# Patient Record
Sex: Male | Born: 1955
Health system: Southern US, Community
[De-identification: ages and names within clinical notes are randomized; demographics above are authoritative.]

## PROBLEM LIST (undated history)

## (undated) DIAGNOSIS — Z9289 Personal history of other medical treatment: Secondary | ICD-10-CM

## (undated) DIAGNOSIS — M1990 Unspecified osteoarthritis, unspecified site: Secondary | ICD-10-CM

## (undated) DIAGNOSIS — N189 Chronic kidney disease, unspecified: Secondary | ICD-10-CM

## (undated) DIAGNOSIS — I10 Essential (primary) hypertension: Secondary | ICD-10-CM

## (undated) DIAGNOSIS — Z973 Presence of spectacles and contact lenses: Secondary | ICD-10-CM

## (undated) DIAGNOSIS — H3530 Unspecified macular degeneration: Secondary | ICD-10-CM

## (undated) DIAGNOSIS — Z87898 Personal history of other specified conditions: Secondary | ICD-10-CM

## (undated) DIAGNOSIS — K219 Gastro-esophageal reflux disease without esophagitis: Secondary | ICD-10-CM

## (undated) DIAGNOSIS — Q825 Congenital non-neoplastic nevus: Secondary | ICD-10-CM

## (undated) DIAGNOSIS — N529 Male erectile dysfunction, unspecified: Secondary | ICD-10-CM

## (undated) DIAGNOSIS — T7840XA Allergy, unspecified, initial encounter: Secondary | ICD-10-CM

## (undated) DIAGNOSIS — R7301 Impaired fasting glucose: Secondary | ICD-10-CM

## (undated) DIAGNOSIS — E785 Hyperlipidemia, unspecified: Secondary | ICD-10-CM

## (undated) DIAGNOSIS — I700 Atherosclerosis of aorta: Secondary | ICD-10-CM

## (undated) DIAGNOSIS — D171 Benign lipomatous neoplasm of skin and subcutaneous tissue of trunk: Secondary | ICD-10-CM

## (undated) DIAGNOSIS — E790 Hyperuricemia without signs of inflammatory arthritis and tophaceous disease: Secondary | ICD-10-CM

## (undated) DIAGNOSIS — R0683 Snoring: Secondary | ICD-10-CM

## (undated) HISTORY — DX: Personal history of other specified conditions: Z87.898

## (undated) HISTORY — DX: Congenital non-neoplastic nevus: Q82.5

## (undated) HISTORY — PX: TONSILLECTOMY: SUR1361

## (undated) HISTORY — DX: Unspecified macular degeneration: H35.30

## (undated) HISTORY — DX: Male erectile dysfunction, unspecified: N52.9

## (undated) HISTORY — DX: Chronic kidney disease, unspecified: N18.9

## (undated) HISTORY — DX: Benign lipomatous neoplasm of skin and subcutaneous tissue of trunk: D17.1

## (undated) HISTORY — DX: Gastro-esophageal reflux disease without esophagitis: K21.9

## (undated) HISTORY — DX: Impaired fasting glucose: R73.01

## (undated) HISTORY — DX: Allergy, unspecified, initial encounter: T78.40XA

## (undated) HISTORY — DX: Essential (primary) hypertension: I10

## (undated) HISTORY — PX: OTHER SURGICAL HISTORY: SHX169

## (undated) HISTORY — DX: Personal history of other medical treatment: Z92.89

## (undated) HISTORY — DX: Presence of spectacles and contact lenses: Z97.3

## (undated) HISTORY — DX: Hyperlipidemia, unspecified: E78.5

## (undated) HISTORY — DX: Snoring: R06.83

## (undated) HISTORY — DX: Unspecified osteoarthritis, unspecified site: M19.90

## (undated) HISTORY — DX: Hyperuricemia without signs of inflammatory arthritis and tophaceous disease: E79.0

---
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
## 1998-05-07 ENCOUNTER — Ambulatory Visit (HOSPITAL_COMMUNITY): Admission: RE | Admit: 1998-05-07 | Discharge: 1998-05-07 | Payer: Self-pay | Admitting: Urology

## 2003-12-31 ENCOUNTER — Ambulatory Visit (HOSPITAL_COMMUNITY): Admission: RE | Admit: 2003-12-31 | Discharge: 2003-12-31 | Payer: Self-pay | Admitting: Thoracic Surgery

## 2004-02-03 ENCOUNTER — Encounter: Admission: RE | Admit: 2004-02-03 | Discharge: 2004-02-03 | Payer: Self-pay | Admitting: Thoracic Surgery

## 2004-02-11 ENCOUNTER — Encounter (INDEPENDENT_AMBULATORY_CARE_PROVIDER_SITE_OTHER): Payer: Self-pay | Admitting: Specialist

## 2004-02-11 ENCOUNTER — Inpatient Hospital Stay (HOSPITAL_COMMUNITY): Admission: RE | Admit: 2004-02-11 | Discharge: 2004-02-13 | Payer: Self-pay | Admitting: Thoracic Surgery

## 2004-02-18 ENCOUNTER — Encounter: Admission: RE | Admit: 2004-02-18 | Discharge: 2004-02-18 | Payer: Self-pay | Admitting: Thoracic Surgery

## 2004-03-10 ENCOUNTER — Encounter: Admission: RE | Admit: 2004-03-10 | Discharge: 2004-03-10 | Payer: Self-pay | Admitting: Thoracic Surgery

## 2004-04-07 ENCOUNTER — Encounter: Admission: RE | Admit: 2004-04-07 | Discharge: 2004-04-07 | Payer: Self-pay | Admitting: Thoracic Surgery

## 2004-08-10 ENCOUNTER — Encounter: Admission: RE | Admit: 2004-08-10 | Discharge: 2004-08-10 | Payer: Self-pay | Admitting: Thoracic Surgery

## 2005-09-04 HISTORY — PX: OTHER SURGICAL HISTORY: SHX169

## 2006-08-08 ENCOUNTER — Encounter: Admission: RE | Admit: 2006-08-08 | Discharge: 2006-08-08 | Payer: Self-pay | Admitting: Family Medicine

## 2006-09-04 HISTORY — PX: COLONOSCOPY: SHX174

## 2007-07-30 LAB — HM COLONOSCOPY

## 2008-09-20 ENCOUNTER — Emergency Department (HOSPITAL_COMMUNITY): Admission: EM | Admit: 2008-09-20 | Discharge: 2008-09-20 | Payer: Self-pay | Admitting: Family Medicine

## 2008-09-25 ENCOUNTER — Encounter: Admission: RE | Admit: 2008-09-25 | Discharge: 2008-09-25 | Payer: Self-pay | Admitting: Orthopedic Surgery

## 2009-06-04 DIAGNOSIS — Z9289 Personal history of other medical treatment: Secondary | ICD-10-CM

## 2009-06-04 HISTORY — DX: Personal history of other medical treatment: Z92.89

## 2009-08-04 HISTORY — PX: NM MYOVIEW LTD: HXRAD82

## 2010-09-25 ENCOUNTER — Encounter: Payer: Self-pay | Admitting: Family Medicine

## 2010-09-25 ENCOUNTER — Encounter: Payer: Self-pay | Admitting: Thoracic Surgery

## 2010-12-19 LAB — DIFFERENTIAL
Basophils Absolute: 0 10*3/uL (ref 0.0–0.1)
Basophils Relative: 0 % (ref 0–1)
Eosinophils Absolute: 0.1 10*3/uL (ref 0.0–0.7)
Eosinophils Relative: 2 % (ref 0–5)
Lymphocytes Relative: 30 % (ref 12–46)
Lymphs Abs: 1.7 10*3/uL (ref 0.7–4.0)
Monocytes Absolute: 0.5 10*3/uL (ref 0.1–1.0)
Monocytes Relative: 10 % (ref 3–12)
Neutro Abs: 3.3 10*3/uL (ref 1.7–7.7)
Neutrophils Relative %: 58 % (ref 43–77)

## 2010-12-19 LAB — CBC
HCT: 44.1 % (ref 39.0–52.0)
Hemoglobin: 14.6 g/dL (ref 13.0–17.0)
MCHC: 33.1 g/dL (ref 30.0–36.0)
MCV: 95.4 fL (ref 78.0–100.0)
Platelets: 308 10*3/uL (ref 150–400)
RBC: 4.62 MIL/uL (ref 4.22–5.81)
RDW: 13.1 % (ref 11.5–15.5)
WBC: 5.7 10*3/uL (ref 4.0–10.5)

## 2010-12-19 LAB — URIC ACID: Uric Acid, Serum: 6.6 mg/dL (ref 4.0–7.8)

## 2011-01-20 NOTE — H&P (Signed)
NAME:  Dave Carpenter, Dave Carpenter                           ACCOUNT NO.:  [id number]   MEDICAL RECORD NO.:  [id number]                   PATIENT TYPE:  INP   LOCATION:  NA                                   FACILITY:  [facility]   PHYSICIAN:  Ines Bloomer, M.D.              DATE OF BIRTH:  07-12-1956   DATE OF ADMISSION:  DATE OF DISCHARGE:                                HISTORY & PHYSICAL   ANTICIPATED DATE OF ADMISSION:  June 9.   PRIMARY CARE Dave Carpenter:  Dr. Ivonne Carpenter.   UROLOGIST:  Dr. Dennison Nancy. Carpenter.   HISTORY OF PRESENT ILLNESS:  This is a 55 year old, African-American male  who is referred from Dr. Aldean Carpenter for evaluation of a right lower lobe lung  lesion.  The patient was undergoing a urologic workup that included a CT  scan of the bladder and lower chest.  A right lower lobe superior segment  lesion was identified.  A full CT scan followed and revealed a 2.6 x 2.5 cm  nodule in the right lower lobe with internal calcification.  This was read  as carcinoma versus hamartoma.  He was evaluated at the [facility] office by Dr.  Edwyna Carpenter on December 22, 2003.  Evaluation included pulmonary function tests that  revealed FVC 2.6 and FEV1 of 2.15.  On evaluation he reported no bronchitis,  asthma, hemoptysis and no weight loss.  Dr. Edwyna Carpenter recommended proceeding  with a PET scan.  This was performed on April 28.  It was negative for  activity in the right lower lobe and Dr. Scheryl Carpenter impression was a probable  hamartoma.  Given it's size, he recommended excision via VATS approach.  The  patient elected not to proceed at that time.  He returned to the office on  June 1 following a chest x-ray.  The lesion now measured 3 cm in size.  Even  though this lesion is felt to be benign, Dr. Edwyna Carpenter still recommends  proceeding with surgical excision.  The procedure, risks and benefits were  discussed with Dave Carpenter.  He agrees to proceed with surgery at this time.  He is here today for a preoperative history  and physical exam.   PAST MEDICAL HISTORY:  1. Hypertension.  2. ED.   PAST SURGICAL HISTORY:  1. Tonsillectomy as a child.  2. Ganglion cystectomy on the right wrist.  3. Circumcision 2000.   He is allergic to ASPIRIN, causes a nervous stomach and a rash.   MEDICATIONS PRIOR TO ADMISSION:  Cialis on occasion.   SOCIAL HISTORY:  He is single.  He has one daughter, 61 years old.  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  He continues to smoke cigarettes;  now down to one half pack a day.  He does not drink alcohol.  He lives with  his significant other, Badanabuis, in a multilevel dwelling.  She will be  available for  assistance after discharge from the hospital.   FAMILY HISTORY:  Mother and father both have diabetes.  He has one brother  with sickle cell trait.   REVIEW OF SYSTEMS:  GENERAL:  Mr. Dave Carpenter feels that his health is very good.  He has had some mild change in his vision.  No changes in hearing.  No  difficulties chewing or swallowing.  He does wear eyeglasses.  He has had no  dyspnea on exertion.  He does have a dry cough.  No chest pain.  No GI  complaints, specifically no changes in bowel habits.  No nausea, vomiting or  diarrhea.  GU:  He has had microscopic hematuria, worked up by Dr.  Aldean Carpenter; cause unknown.  No dizziness, syncope or falls.  He does have  bad knees.  They occasionally hurt when he is ambulating.  No calf  claudication.  No skin rashes or breakdown.  No recent infection, fever.  No  history of blood clots.  No symptoms of depression or anxiety.   PHYSICAL EXAMINATION:  VITAL SIGNS:  Blood pressure 128/94; heart rate 100;  he is 6 feet 1 inch tall; approximately 195 to 198 pounds.  EYES:  PERRLA, EOMI.  Oral mucosa is pink and moist.  NECK:  Full range of motion.  No carotid bruits, thyromegaly, or  lymphadenopathy.  LUNGS:  His breathing is nonlabored.  His breath sounds are clear  bilaterally.  HEART:  Regular rate and rhythm.   ABDOMEN:  Flat with bowel sounds.  Soft and nontender.  EXTREMITIES:  He has no lower extremity edema.  No clubbing or cyanosis of  his extremities.  NEUROLOGIC:  He is alert and oriented.  Cranial nerves II to XII are grossly  intact.  His gait is steady.  He has full range of motion in all  extremities.  Muscle strength is 5/5 in all of his extremities.   Laboratory studies are pending and will include CBC, BMET, PT/INR and a  urinalysis.   IMPRESSION:  This is a 55 year old man with a right lower lobe lung nodule.   PLAN:  Elective admission to [facility] in the care of Dr. Edwyna Carpenter  on February 11, 2004 for right video-assisted thoracoscopy and wedge resection of  a right lower lobe nodule.  The patient has been counseled regarding smoking  cessation and plans to quit on admission to the hospital.      Dave Carpenter, N.P.                  Ines Bloomer, M.D.    CTK/MEDQ  D:  02/09/2004  T:  02/10/2004  Job:  [id number]   cc:   Ines Bloomer, M.D.  [address]  [place]  [place] [address]

## 2011-01-20 NOTE — Op Note (Signed)
NAME:  Dave Carpenter, Dave Carpenter NO.:  [id number]   MEDICAL RECORD NO.:  [id number]                   PATIENT TYPE:  INP   LOCATION:  2899                                 FACILITY:  [facility]   PHYSICIAN:  Ines Bloomer, M.D.              DATE OF BIRTH:  1956/05/10   DATE OF PROCEDURE:  DATE OF DISCHARGE:                                 OPERATIVE REPORT   PREOPERATIVE DIAGNOSIS:  Right lower lobe lesion, possible hamartoma.   POSTOPERATIVE DIAGNOSIS:  Hamartoma, right lower lobe.   OPERATION PERFORMED:  Right video assisted thoracoscopic surgery, resection  of right lower lobe lesion.   SURGEON:  Ines Bloomer, M.D.   ASSISTANT:  Jerold Coombe, P.A.   ANESTHESIA:  General.   DESCRIPTION OF PROCEDURE:  After general anesthesia and insertion of dual  lumen tube, the patient turned to the right lateral thoracotomy position,  was prepped and draped in the usual sterile manner.  Trocar site was made in  the seventh intercostal space at the anterior axillary line.  The lung had  been deflated and a 30 degree scope was inserted and a large lesion was seen  in the superior segment of the right lower lobe.  This appeared to be a  hamartoma that was partially extrapleural.  Right over the lesion was made a  2 to 3 cm incision, taking care to do minimal spreading or no spreading of  intercostal space.  Latissimus was partially divided and the intercostal  space was entered. The lung right next to the lesion was then grasped with a  #1 Kaiser ring forceps and then bring in the EZ-45 stapler through this  site, this was fired medially to laterally into the lung and then we were  able to switching position bring the stapler in through the medial trocar  site with three applications, we resected the lesion.  We then put CoSeal on  the staple line in the usual fashion.  Chest tube was inserted through the  trocar site.  Marcaine block was done in the usual fashion.   Frozen section  revealed a hamartoma.  A 2 to 3 cm trocar site was closed with one #1 Vicryl  in the muscle layer, 2-0 Vicryl in the subcutaneous tissue and 3-0 Vicryl as  a subcuticular stitch.  The patient was returned to the recovery room in  stable condition.  It might be mentioned that we extracted the lesion  through the trocar site using a Ethicon EndoPouch.                                               Ines Bloomer, M.D.    DPB/MEDQ  D:  02/11/2004  T:  02/11/2004  Job:  [id number]

## 2011-01-20 NOTE — Discharge Summary (Signed)
NAME:  Dave Carpenter, Dave Carpenter NO.:  [id number]   MEDICAL RECORD NO.:  [id number]                   PATIENT TYPE:  INP   LOCATION:  3308                                 FACILITY:  [facility]   PHYSICIAN:  Ines Bloomer, M.D.              DATE OF BIRTH:  04-Jan-1956   DATE OF ADMISSION:  02/11/2004  DATE OF DISCHARGE:  02/13/2004                                 DISCHARGE SUMMARY   PRIMARY ADMITTING DIAGNOSIS:  Right lower lobe lung lesion.   ADDITIONAL/DISCHARGE DIAGNOSES:  1. Right lower lobe hamartoma.  2. History of hypertension.  3. History of erectile dysfunction.   PROCEDURES PERFORMED:  Right VATS with right mini thoracotomy, resection of  right lower lobe lung lesion.   HISTORY:  The patient is a 55 year old black male who was referred to Dr.  Edwyna Shell from Dr. Aldean Ast for evaluation of a right lower lobe lung lesion.  The patient was in the process of undergoing urologic workup and in the  course of this workup had a CT scan of the bladder and lower chest.  An  incidental finding of a right lower lobe superior segment lesion was noted.  A full CT scan of the chest was then performed and this revealed a 2.6 x 2.5  cm nodule in the right lower lobe with internal calcification which was felt  to represent either a carcinoma or a hamartoma.  Patient was seen by Dr.  Edwyna Shell and although he had remained asymptomatic, denying weight loss,  fevers, chills, shortness of breath, chest pain, hemoptysis or dyspnea on  exertion, Dr. Edwyna Shell recommended proceeding with a PET scan.  This was  performed on December 31, 2003 and showed no activity in the right lower lobe.  It was felt that this did represent a hamartoma most likely and Dr. Edwyna Shell  recommended proceeding with a right VATS and excision of the lesion.  At the  time the patient elected not to proceed with surgery.  He returned for  followup on February 03, 2004 with a new chest x-ray which showed an increase in  size of lesion now to 3 cm.  Again because of the increase in the size of  the lesion Dr. Edwyna Shell recommended proceeding with surgery and at this point  the patient agreed to proceed.   HOSPITAL COURSE:  He was admitted to [facility] on February 11, 2004 and was  taken to the operating room where he underwent a right VATS with a right  mini thoracotomy and resection of the right lower lobe lesion.  Intraoperative frozen section and final pathology revealed a hamartoma.  Patient tolerated the procedure well and was transferred to the 3300 unit in  stable condition.  Postoperatively he has done very well.  On postop day #1  his chest tube had minimal drainage and no evidence of an air leak.  A chest  x-ray showed no  evidence of pneumothorax.  At that time his chest tube was  removed.  A followup chest x-ray after the chest tube removal is pending at  this time.  He has remained afebrile and all vital signs have been stable.  His labs have remained stable with a hemoglobin of 13.3, hematocrit 39.7,  platelets 248, white count 9.9, potassium 4.3, BUN 10, creatinine 1.2.  His  surgical incision site is healing well.  He has been ambulating in the halls  without difficulty.  He is tolerating a regular diet and is having normal  bowel and bladder function.  He has been counseled regarding smoking  cessation and a plan has been made for him to quit smoking.  He has been  given educational information to take home.  It is felt if he continues to  remain stable over the next 24 hours and a PA and lateral chest x-ray on  February 13, 2004 shows no evidence of pneumothorax he will hopefully be ready  for discharge home at that time.   DISCHARGE MEDICATIONS:  Tylox one to two q.4h. p.r.n. for pain.   DISCHARGE INSTRUCTIONS:  He is to refrain from driving, heavy lifting or  strenuous activity.  He may continue ambulating daily and using his  incentive spirometer.  He may shower daily and clean his incisions  with soap  and water.  He will continue his same preoperative diet.   DISCHARGE FOLLOWUP:  He will see Dr. Edwyna Shell back in the office in 1 week and  will have a chest x-ray at [facility] 1 hour prior to this  appointment.  He should bring the films to Dr. Scheryl Darter office for him to  review.  In the interim if he experiences any problems or has questions he  is asked to contact our office immediately.      Coral Ceo, P.A.                        Ines Bloomer, M.D.    GC/MEDQ  D:  02/12/2004  T:  02/13/2004  Job:  [id number]   cc:   Dr. Darcel Bayley Shelle Iron., M.D.  [address]  [place]  [place] [address]  Fax: [phone]

## 2012-02-28 ENCOUNTER — Other Ambulatory Visit: Payer: Self-pay

## 2015-03-24 ENCOUNTER — Encounter: Payer: Self-pay | Admitting: Neurology

## 2015-03-24 ENCOUNTER — Ambulatory Visit (INDEPENDENT_AMBULATORY_CARE_PROVIDER_SITE_OTHER): Payer: Managed Care, Other (non HMO) | Admitting: Neurology

## 2015-03-24 VITALS — BP 142/90 | HR 84 | Resp 20 | Ht 72.0 in | Wt 227.0 lb

## 2015-03-24 DIAGNOSIS — G4763 Sleep related bruxism: Secondary | ICD-10-CM | POA: Diagnosis not present

## 2015-03-24 DIAGNOSIS — G4710 Hypersomnia, unspecified: Secondary | ICD-10-CM | POA: Diagnosis not present

## 2015-03-24 DIAGNOSIS — R0683 Snoring: Secondary | ICD-10-CM | POA: Insufficient documentation

## 2015-03-24 DIAGNOSIS — G2581 Restless legs syndrome: Secondary | ICD-10-CM

## 2015-03-24 DIAGNOSIS — G4730 Sleep apnea, unspecified: Secondary | ICD-10-CM

## 2015-03-24 NOTE — Patient Instructions (Signed)
Sleep Apnea  Sleep apnea is a sleep disorder characterized by abnormal pauses in breathing while you sleep. When your breathing pauses, the level of oxygen in your blood decreases. This causes you to move out of deep sleep and into light sleep. As a result, your quality of sleep is poor, and the system that carries your blood throughout your body (cardiovascular system) experiences stress. If sleep apnea remains untreated, the following conditions can develop:  High blood pressure (hypertension).  Coronary artery disease.  Inability to achieve or maintain an erection (impotence).  Impairment of your thought process (cognitive dysfunction). There are three types of sleep apnea: 1. Obstructive sleep apnea--Pauses in breathing during sleep because of a blocked airway. 2. Central sleep apnea--Pauses in breathing during sleep because the area of the brain that controls your breathing does not send the correct signals to the muscles that control breathing. 3. Mixed sleep apnea--A combination of both obstructive and central sleep apnea. RISK FACTORS The following risk factors can increase your risk of developing sleep apnea:  Being overweight.  Smoking.  Having narrow passages in your nose and throat.  Being of older age.  Being male.  Alcohol use.  Sedative and tranquilizer use.  Ethnicity. Among individuals younger than 35 years, African Americans are at increased risk of sleep apnea. SYMPTOMS   Difficulty staying asleep.  Daytime sleepiness and fatigue.  Loss of energy.  Irritability.  Loud, heavy snoring.  Morning headaches.  Trouble concentrating.  Forgetfulness.  Decreased interest in sex. DIAGNOSIS  In order to diagnose sleep apnea, your caregiver will perform a physical examination. Your caregiver may suggest that you take a home sleep test. Your caregiver may also recommend that you spend the night in a sleep lab. In the sleep lab, several monitors record  information about your heart, lungs, and brain while you sleep. Your leg and arm movements and blood oxygen level are also recorded. TREATMENT The following actions may help to resolve mild sleep apnea:  Sleeping on your side.   Using a decongestant if you have nasal congestion.   Avoiding the use of depressants, including alcohol, sedatives, and narcotics.   Losing weight and modifying your diet if you are overweight. There also are devices and treatments to help open your airway:  Oral appliances. These are custom-made mouthpieces that shift your lower jaw forward and slightly open your bite. This opens your airway.  Devices that create positive airway pressure. This positive pressure "splints" your airway open to help you breathe better during sleep. The following devices create positive airway pressure:  Continuous positive airway pressure (CPAP) device. The CPAP device creates a continuous level of air pressure with an air pump. The air is delivered to your airway through a mask while you sleep. This continuous pressure keeps your airway open.  Nasal expiratory positive airway pressure (EPAP) device. The EPAP device creates positive air pressure as you exhale. The device consists of single-use valves, which are inserted into each nostril and held in place by adhesive. The valves create very little resistance when you inhale but create much more resistance when you exhale. That increased resistance creates the positive airway pressure. This positive pressure while you exhale keeps your airway open, making it easier to breath when you inhale again.  Bilevel positive airway pressure (BPAP) device. The BPAP device is used mainly in patients with central sleep apnea. This device is similar to the CPAP device because it also uses an air pump to deliver continuous air pressure   through a mask. However, with the BPAP machine, the pressure is set at two different levels. The pressure when you  exhale is lower than the pressure when you inhale.  Surgery. Typically, surgery is only done if you cannot comply with less invasive treatments or if the less invasive treatments do not improve your condition. Surgery involves removing excess tissue in your airway to create a wider passage way. Document Released: 08/11/2002 Document Revised: 12/16/2012 Document Reviewed: 12/28/2011 [employer] Patient Information 2015 [employer]. This information is not intended to replace advice given to you by your health care provider. Make sure you discuss any questions you have with your health care provider.  

## 2015-03-24 NOTE — Progress Notes (Signed)
SLEEP MEDICINE CLINIC   Provider:  Larey Seat, M D  Referring Provider: Barney Drain, DMD Primary Care Physician:  No primary care provider on file.  Chief Complaint  Patient presents with  . sleep consult    snoring, osa eval, rm 10, alone    HPI:  Dave Carpenter is a 59 y.o. male  Is seen here as a referralfrom Dave Carpenter ,DDS  for a sleep apnea evaluation,  [name] is an african american right handed male patient, working as an Database administrator. He works form 8 - 5 daily, has no history of shift work.  The patient's was seen at [facility] for a regular dental appointment where he filled a questionnaire that was so suspicious for him having obstructive sleep apnea. Primary care physician is Dr. Izora Gala. In short the sleep screening questionnaire endorsed that the patient is 6 foot tall has a weight of 225 pounds and endorsed the Epworth sleepiness score at 5 points. Attention he snores frequently he feels tired in daytime also he doesn't necessarily feel that he is ready to fall asleep he has bruxism and neck size over 17 inches and has had witnessed apneas.  That habits are as follows usually goes to sleep on his cough around 10:00 PM and transversely the bedroom between 2 and 3 AM. His wife works the late shift and comes home at the time when he transfers. His wife witnessed him to snore loudly and has seen him with irregular breathing. He wakes up about 6:30 every morning spontaneously and without the need for an alarm to be set. As no nocturia breaks. Otherwise his sleep is unfragmented. He states that he feels actually lousy in the morning and neither refreshed nor restored his aching he feels sore and worn. He is reportedly a restless sleeper and tosses and turns at night and he has bruxism which is a form of periodic limb movements. Also endorsed some restless leg syndrome symptoms. He sometimes has the irresistible urge to move to stop the feeling of  a creepy crawly sensation.  He does not have a daytime nap nor urge. He used to nap over his lunch break in his car. He stopped that. His naps were more refreshing then the nocturnal sleep. He sometimes sleeps on Sunday after Lunch.   The patient has no history of trauma to the airway neck or facial skull no obstruction, he is unaware of any sleep disorders affecting him in childhood such as sleepwalking or sleep talking. Family history is also negative for sleep disorders or sleep disordered breathing.  He is a non smoker, quit 2006, he quit drinking, 2 sodas a day , coke.    Review of Systems: Out of a complete 14 system review, the patient complains of only the following symptoms, and all other reviewed systems are negative. Snoring, sleep apnea, feeling exhausted. RLS - creepy crawly .   Epworth score 9 , Fatigue severity score 22  , depression score 0.   History   Social History  . Marital Status: Single    Spouse Name: N/A  . Number of Children: N/A  . Years of Education: N/A   Occupational History  . [employer]    Social History Main Topics  . Smoking status: Former Smoker    Quit date: 09/04/2004  . Smokeless tobacco: Not on file  . Alcohol Use: No  . Drug Use: No  . Sexual Activity: Not on file   Other  Topics Concern  . Not on file   Social History Narrative   Drinks 32 oz of soda daily.    Family History  Problem Relation Age of Onset  . Diabetes Mother   . Stroke Mother   . Stroke Father   . Diabetes Father     Past Medical History  Diagnosis Date  . Hypertension   . Seizures     Past Surgical History  Procedure Laterality Date  . Tonsillectomy    . Ganglion cyst removal    . Right lung surgery to remove mass      Current Outpatient Prescriptions  Medication Sig Dispense Refill  . carvedilol (COREG) 6.25 MG tablet Take 6.25 mg by mouth 2 (two) times daily with a meal.    . lisinopril (PRINIVIL,ZESTRIL) 5 MG tablet Take 5 mg by mouth daily.    .  simvastatin (ZOCOR) 40 MG tablet Take 40 mg by mouth daily.     No current facility-administered medications for this visit.    Allergies as of 03/24/2015 - Review Complete 03/24/2015  Allergen Reaction Noted  . Aspirin  03/23/2015    Vitals: BP 142/90 mmHg  Pulse 84  Resp 20  Ht 6' (1.829 m)  Wt 227 lb (102.967 kg)  BMI 30.78 kg/m2 Last Weight:  Wt Readings from Last 1 Encounters:  03/24/15 227 lb (102.967 kg)       Last Height:   Ht Readings from Last 1 Encounters:  03/24/15 6' (1.829 m)    Physical exam:  General: The patient is awake, alert and appears not in acute distress. The patient is well groomed. Head: Normocephalic, atraumatic. Neck is supple. Mallampati 3  neck circumference:18. Nasal airflow unrestricted , TMJ click on the right  Is evident . Retrognathia is not seen.  Cardiovascular:  Regular rate and rhythm  without  murmurs or carotid bruit, and without distended neck veins. Respiratory: Lungs are clear to auscultation. Skin:  Without evidence of edema, or rash Trunk: BMI is  elevated and patient  has normal posture.  Neurologic exam : The patient is awake and alert, oriented to place and time.   Memory subjective  described as intact. There is a normal attention span & concentration ability.  Speech is fluent without dysarthria, dysphonia or aphasia. Mood and affect are appropriate.  Cranial nerves: Pupils are equal and briskly reactive to light. Funduscopic exam without evidence of pallor or edema.  Extraocular movements  in vertical and horizontal planes intact and without nystagmus. Visual fields by finger perimetry are intact. Hearing to finger rub intact.  Facial sensation intact to fine touch. Facial motor strength is symmetric and tongue and uvula move midline. Symmetric shoulder shrug.   Motor exam:  Normal tone, muscle bulk and symmetric ,strength in all extremities.  Sensory:  Fine touch, pinprick and vibration were tested in all extremities.  Proprioception is  normal.  Coordination: Rapid alternating movements in the fingers/hands is normal.  Finger-to-nose maneuver normal without evidence of ataxia, dysmetria or tremor.  Gait and station: Patient walks without assistive device and is able unassisted to climb up to the exam table. Strength within normal limits. Stance is stable and normal.  Deep tendon reflexes: in the  upper and lower extremities are symmetric and intact. Babinski maneuver response is downgoing.   Assessment:  After physical and neurologic examination, review of laboratory studies, imaging, neurophysiology testing and pre-existing records, assessment is :   Mr. Guinta has several risk factors for sleep apnea including observed  snoring. One is his larger neck circumference and high-grade Mallampati, he is also excessively daytime fatigue and also he may not fall asleep uncontrollably. In the past naps in daytime for felt as very refreshing he now takes sometimes Sunday afternoon nap. Hypersomnia is definitely to be investigated further and I will order a polysomnogram. He does not wake up with headaches but he has sometimes bruxism the bruxism has been reported through his dentist as well. What I would prefer an attended polysomnography this patient is more likely to receive a home sleep test based on his insurance company. This would be a valid screening test. If the patient does not suffer from significant oxygen desaturation in spite of apnea he would be a good dental disc candidate rather than a CPAP candidate. I spent 50% of today's face-to-face time discussing the diagnosis and differential and testing options.   The patient was advised of the nature of the diagnosed sleep disorder , the treatment options and risks for general a health and wellness arising from not treating the condition. Visit duration was 30 minutes.   Plan:  Treatment plan and additional workup :  SPLIT at AHI 15 , score at 45 AETNA. If not  permitted change to HST<      Larey Seat MD  03/24/2015

## 2015-09-05 DIAGNOSIS — I700 Atherosclerosis of aorta: Secondary | ICD-10-CM

## 2015-09-05 HISTORY — DX: Atherosclerosis of aorta: I70.0

## 2016-01-06 ENCOUNTER — Encounter: Payer: Self-pay | Admitting: Medical

## 2016-01-06 ENCOUNTER — Ambulatory Visit (INDEPENDENT_AMBULATORY_CARE_PROVIDER_SITE_OTHER): Payer: Managed Care, Other (non HMO) | Admitting: Medical

## 2016-01-06 VITALS — BP 136/98 | HR 90 | Wt 227.0 lb

## 2016-01-06 DIAGNOSIS — Z87891 Personal history of nicotine dependence: Secondary | ICD-10-CM | POA: Insufficient documentation

## 2016-01-06 DIAGNOSIS — I10 Essential (primary) hypertension: Secondary | ICD-10-CM

## 2016-01-06 DIAGNOSIS — E785 Hyperlipidemia, unspecified: Secondary | ICD-10-CM | POA: Diagnosis not present

## 2016-01-06 DIAGNOSIS — K219 Gastro-esophageal reflux disease without esophagitis: Secondary | ICD-10-CM | POA: Diagnosis not present

## 2016-01-06 DIAGNOSIS — N529 Male erectile dysfunction, unspecified: Secondary | ICD-10-CM | POA: Diagnosis not present

## 2016-01-06 DIAGNOSIS — E7849 Other hyperlipidemia: Secondary | ICD-10-CM | POA: Insufficient documentation

## 2016-01-06 MED ORDER — SILDENAFIL CITRATE 20 MG PO TABS: ORAL_TABLET | ORAL | Status: DC

## 2016-01-06 MED ORDER — OMEPRAZOLE 40 MG PO CPDR: 40.0000 mg | DELAYED_RELEASE_CAPSULE | Freq: Every day | ORAL | Status: DC

## 2016-01-06 MED ORDER — CARVEDILOL 6.25 MG PO TABS: 6.2500 mg | ORAL_TABLET | Freq: Two times a day (BID) | ORAL | Status: DC

## 2016-01-06 MED ORDER — SIMVASTATIN 40 MG PO TABS: 40.0000 mg | ORAL_TABLET | Freq: Every day | ORAL | Status: DC

## 2016-01-06 NOTE — Progress Notes (Signed)
Subjective: Chief Complaint  Patient presents with  . New Patient (Initial Visit)    has pill bottles. no problems or concerns.   Here as a new patient.   Was seeing Dr. Izora Gala at [facility] prior.   Wants to establish.   curious about refills, and having some aches and pains.   Has hx/o hypertension.  doesn't check BPs regularly.     Saw cardiology 4 years ago for tachycardia.   Had stress test, was changed to new medications.  Was advised he had elevated heart rate.   denies chest pain, SOB, edema, but does get burning in upper chest at times, attributes the burn to GERD.   Is a former smoker.   Exercise - bowling.   Works as an Cabin crew at [employer].     Last labs last June.   Last EKG about 2 years ago.  Past Medical History  Diagnosis Date  . Hypertension   . Hyperlipidemia   . Allergy   . Wears glasses   . GERD (gastroesophageal reflux disease)   . Erectile dysfunction   . Arthritis     knees, shouders, wrists  . Seizures ([place])     as a child.  few times, never on medication.  no seizures as a adult   ROS as in subjective   Objective: BP 136/98 mmHg  Pulse 90  Wt 227 lb (102.967 kg)  General appearance: alert, no distress, WD/WN, AA male Oral cavity: MMM, no lesions Neck: supple, no lymphadenopathy, no thyromegaly, no masses, no bruits Heart: RRR, normal S1, S2, no murmurs Lungs: CTA bilaterally, no wheezes, rhonchi, or rales Pulses: 2+ symmetric, upper and lower extremities, normal cap refill Ext: no edema Neuro: CN2-12 intact Psych: pleasant good eye contact, answers questions appropriately     Assessment: Encounter Diagnoses  Name Primary?  . Essential hypertension Yes  . Dyslipidemia   . Former smoker   . Gastroesophageal reflux disease without esophagitis   . Erectile dysfunction, unspecified erectile dysfunction type     Plan: Restart coreg, restart simvastatin, c/t Prilosec and revatio prn.  discussed heart disease risk factors, risk  reduction, diet and need for better diet and exercise efforts.   Will request prior records.   Plan for physical and labs in 36mo.  Dave Carpenter was seen today for new patient (initial visit).  Diagnoses and all orders for this visit:  Essential hypertension  Dyslipidemia  Former smoker  Gastroesophageal reflux disease without esophagitis  Erectile dysfunction, unspecified erectile dysfunction type  Other orders -     simvastatin (ZOCOR) 40 MG tablet; Take 1 tablet (40 mg total) by mouth daily. Reported on 01/06/2016 -     carvedilol (COREG) 6.25 MG tablet; Take 1 tablet (6.25 mg total) by mouth 2 (two) times daily with a meal. Reported on 01/06/2016 -     omeprazole (PRILOSEC) 40 MG capsule; Take 1 capsule (40 mg total) by mouth daily. -     sildenafil (REVATIO) 20 MG tablet; 2-5 tablets daily prn

## 2016-01-26 ENCOUNTER — Telehealth: Payer: Self-pay | Admitting: Internal Medicine

## 2016-01-26 NOTE — Telephone Encounter (Signed)
Received records from [facility]

## 2016-02-07 ENCOUNTER — Encounter: Payer: Self-pay | Admitting: Medical

## 2016-02-18 ENCOUNTER — Telehealth: Payer: Self-pay

## 2016-02-18 NOTE — Telephone Encounter (Signed)
Stress test results from 2010 placed in your folder for review. Dave Carpenter

## 2016-02-24 ENCOUNTER — Encounter: Payer: Self-pay | Admitting: Medical

## 2016-04-02 ENCOUNTER — Other Ambulatory Visit: Payer: Self-pay | Admitting: Medical

## 2016-04-10 ENCOUNTER — Encounter: Payer: Self-pay | Admitting: Medical

## 2016-04-10 ENCOUNTER — Ambulatory Visit (INDEPENDENT_AMBULATORY_CARE_PROVIDER_SITE_OTHER): Payer: Managed Care, Other (non HMO) | Admitting: Medical

## 2016-04-10 VITALS — BP 140/90 | HR 80 | Resp 16 | Ht 70.5 in | Wt 234.0 lb

## 2016-04-10 DIAGNOSIS — N529 Male erectile dysfunction, unspecified: Secondary | ICD-10-CM

## 2016-04-10 DIAGNOSIS — G4730 Sleep apnea, unspecified: Secondary | ICD-10-CM

## 2016-04-10 DIAGNOSIS — E785 Hyperlipidemia, unspecified: Secondary | ICD-10-CM

## 2016-04-10 DIAGNOSIS — G4710 Hypersomnia, unspecified: Secondary | ICD-10-CM

## 2016-04-10 DIAGNOSIS — K219 Gastro-esophageal reflux disease without esophagitis: Secondary | ICD-10-CM | POA: Diagnosis not present

## 2016-04-10 DIAGNOSIS — Z0000 Encounter for general adult medical examination without abnormal findings: Secondary | ICD-10-CM | POA: Diagnosis not present

## 2016-04-10 DIAGNOSIS — Z125 Encounter for screening for malignant neoplasm of prostate: Secondary | ICD-10-CM | POA: Insufficient documentation

## 2016-04-10 DIAGNOSIS — Z87891 Personal history of nicotine dependence: Secondary | ICD-10-CM

## 2016-04-10 DIAGNOSIS — H3530 Unspecified macular degeneration: Secondary | ICD-10-CM

## 2016-04-10 DIAGNOSIS — Z1159 Encounter for screening for other viral diseases: Secondary | ICD-10-CM

## 2016-04-10 DIAGNOSIS — Z23 Encounter for immunization: Secondary | ICD-10-CM

## 2016-04-10 DIAGNOSIS — G2581 Restless legs syndrome: Secondary | ICD-10-CM

## 2016-04-10 DIAGNOSIS — G4763 Sleep related bruxism: Secondary | ICD-10-CM | POA: Diagnosis not present

## 2016-04-10 DIAGNOSIS — Z7189 Other specified counseling: Secondary | ICD-10-CM

## 2016-04-10 DIAGNOSIS — N183 Chronic kidney disease, stage 3 unspecified: Secondary | ICD-10-CM

## 2016-04-10 DIAGNOSIS — E669 Obesity, unspecified: Secondary | ICD-10-CM

## 2016-04-10 DIAGNOSIS — R0683 Snoring: Secondary | ICD-10-CM

## 2016-04-10 DIAGNOSIS — I10 Essential (primary) hypertension: Secondary | ICD-10-CM | POA: Diagnosis not present

## 2016-04-10 DIAGNOSIS — N189 Chronic kidney disease, unspecified: Secondary | ICD-10-CM | POA: Insufficient documentation

## 2016-04-10 DIAGNOSIS — Z7185 Encounter for immunization safety counseling: Secondary | ICD-10-CM

## 2016-04-10 DIAGNOSIS — R079 Chest pain, unspecified: Secondary | ICD-10-CM

## 2016-04-10 LAB — POCT URINALYSIS DIPSTICK
BILIRUBIN UA: NEGATIVE
Glucose, UA: NEGATIVE
KETONES UA: NEGATIVE
Leukocytes, UA: NEGATIVE
Nitrite, UA: NEGATIVE
PH UA: 6
Protein, UA: NEGATIVE
Urobilinogen, UA: NEGATIVE

## 2016-04-10 LAB — LIPID PANEL
CHOL/HDL RATIO: 4.1 ratio (ref <=5.0)
Cholesterol: 178 mg/dL (ref 125–200)
HDL: 43 mg/dL (ref >=40)
LDL CALC: 109 mg/dL (ref <130)
Triglycerides: 132 mg/dL (ref <150)
VLDL: 26 mg/dL (ref <30)

## 2016-04-10 LAB — COMPREHENSIVE METABOLIC PANEL
ALK PHOS: 69 U/L (ref 40–115)
ALT: 26 U/L (ref 9–46)
AST: 20 U/L (ref 10–35)
Albumin: 4.3 g/dL (ref 3.6–5.1)
BUN: 18 mg/dL (ref 7–25)
CALCIUM: 9.6 mg/dL (ref 8.6–10.3)
CHLORIDE: 104 mmol/L (ref 98–110)
CO2: 26 mmol/L (ref 20–31)
Creat: 1.27 mg/dL (ref 0.70–1.33)
GLUCOSE: 101 mg/dL — AB (ref 65–99)
POTASSIUM: 4.6 mmol/L (ref 3.5–5.3)
Sodium: 141 mmol/L (ref 135–146)
Total Bilirubin: 0.4 mg/dL (ref 0.2–1.2)
Total Protein: 7.1 g/dL (ref 6.1–8.1)

## 2016-04-10 LAB — CBC
HEMATOCRIT: 45.5 % (ref 38.5–50.0)
Hemoglobin: 15.2 g/dL (ref 13.2–17.1)
MCH: 31.4 pg (ref 27.0–33.0)
MCHC: 33.4 g/dL (ref 32.0–36.0)
MCV: 94 fL (ref 80.0–100.0)
MPV: 9.8 fL (ref 7.5–12.5)
Platelets: 247 10*3/uL (ref 140–400)
RBC: 4.84 MIL/uL (ref 4.20–5.80)
RDW: 14.1 % (ref 11.0–15.0)
WBC: 4.9 10*3/uL (ref 4.0–10.5)

## 2016-04-10 NOTE — Patient Instructions (Signed)
Encounter Diagnoses  Name Primary?  . Annual physical exam Yes  . Essential hypertension   . Gastroesophageal reflux disease without esophagitis   . Bruxism, sleep-related   . Erectile dysfunction, unspecified erectile dysfunction type   . Snoring   . Hypersomnia with sleep apnea   . RLS (restless legs syndrome)   . Former smoker   . Dyslipidemia   . Screening for prostate cancer   . Need for Tdap vaccination   . Vaccine counseling   . Macular degeneration   . Chronic kidney disease, stage 3 (moderate)   . Need for hepatitis C screening test   . Obesity   . Chest pain, unspecified chest pain type    Recommendations  See your eye doctor yearly for routine vision care.  See your dentist yearly for routine dental care including hygiene visits twice yearly.  Pending labs, we will need to make adjustments to your blood pressure regimen  Exercise regularly, eat a healthy low fat diet, avoid salt  We will get copy of last colonoscopy from Dr. Benson Norway  Pending labs, we will give recommendations about the chest pain  Return yearly for physical  Get a flu shot next month

## 2016-04-10 NOTE — Addendum Note (Signed)
Addended by: Arley Phenix L on: 04/10/2016 09:30 AM   Modules accepted: Orders

## 2016-04-10 NOTE — Progress Notes (Signed)
Subjective:   HPI  RAFER Carpenter is a 60 y.o. male who presents for a complete physical.  He came in as a new patient back in May 2017. Chief Complaint  Patient presents with  . Annual Exam    fasting. concerns: 2 weeks ago pain across chest with lightheadedness and SHOB. describes as hyperventalition. wears glasses. last eye exam 02.2017. urine collected.     Medical care team/other doctors includes:  Crisoforo Oxford, PA-C here for primary care  GI, Dr. Benson Norway   Preventative care: Last physical or labs: 1+ year ago Sees dentist yearly: Yes Last tetanus vaccine, TD or Tdap: unsure   Concerns: He notes few weeks ago having some chest pain.  Was in top of chest, would last for 5-6 minutes but then go away.  Sometimes would get dizziness, lightheaded feeling.  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   happened several times.   Last episode was last week.   compliant with medications, but not checking BPs of late.   Reviewed their medical, surgical, family, social, medication, and allergy history and updated chart as appropriate.  Past Medical History:  Diagnosis Date  . Allergy   . Arthritis    knees, shouders, wrists  . Birth mark    upper abdomen  . Erectile dysfunction   . GERD (gastroesophageal reflux disease)   . H/O cardiovascular stress test 06/2009   normal nuclear stress test, Dr. Quay Burow  . History of seizure    few times as a child, none as an adult  . Hyperlipidemia   . Hypertension   . Lipoma of back    since 2007 or earlier  . Wears glasses     Past Surgical History:  Procedure Laterality Date  . COLONOSCOPY  2011   Dr. Benson Norway  . ganglion cyst removal    . right lung surgery to remove mass  2007   benign  . TONSILLECTOMY      Social History   Social History  . Marital status: Single    Spouse name: N/A  . Number of children: N/A  . Years of  education: N/A   Occupational History  . [employer]    Social History Main Topics  . Smoking status: Former Smoker    Packs/day: 1.00    Years: 30.00    Quit date: 01/06/2003  . Smokeless tobacco: Never Used  . Alcohol use No  . Drug use: No  . Sexual activity: Not on file   Other Topics Concern  . 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   No particular diet discretion.   Not attending church.  As of 04/2016.    Family History  Problem Relation Age of Onset  . Diabetes Mother   . Stroke Mother   . Other Mother     brain tumor  . Stroke Father   . Diabetes Father   . Heart disease Sister   . Heart disease Brother   . Cancer Neg Hx      Current Outpatient Prescriptions:  .  carvedilol (COREG) 6.25 MG tablet, TAKE 1 TABLET (6.25 MG TOTAL) BY MOUTH 2 (TWO) TIMES DAILY WITH A MEAL. REPORTED ON 01/06/2016, Disp: 180 tablet, Rfl: 0 .  omeprazole (PRILOSEC) 40 MG capsule, TAKE ONE CAPSULE BY MOUTH DAILY, Disp:  90 capsule, Rfl: 0 .  sildenafil (REVATIO) 20 MG tablet, 2-5 tablets daily prn, Disp: 50 tablet, Rfl: 2 .  simvastatin (ZOCOR) 40 MG tablet, TAKE 1 TABLET (40 MG TOTAL) BY MOUTH DAILY. REPORTED ON 01/06/2016, Disp: 90 tablet, Rfl: 0 .  lisinopril (PRINIVIL,ZESTRIL) 5 MG tablet, Take 5 mg by mouth daily. Reported on 01/06/2016, Disp: , Rfl:   Allergies  Allergen Reactions  . Aspirin     Upset stomach, gets hives    Review of Systems Constitutional: -fever, -chills, -sweats, -unexpected weight change, -decreased appetite, -fatigue Allergy: -sneezing, -itching, -congestion Dermatology: -changing moles, --rash, -lumps ENT: -runny nose, -ear pain, -sore throat, -hoarseness, -sinus pain, -teeth pain, - ringing in ears, -hearing loss, -nosebleeds Cardiology: -chest pain, -palpitations, -swelling, -difficulty breathing when lying flat, -waking up short of breath Respiratory: -cough, +shortness of breath, -difficulty  breathing with exercise or exertion, -wheezing, -coughing up blood Gastroenterology: -abdominal pain, -nausea, -vomiting, -diarrhea, -constipation, -blood in stool, -changes in bowel movement, -difficulty swallowing or eating Hematology: -bleeding, -bruising  Musculoskeletal: -joint aches, -muscle aches, -joint swelling, -back pain, -neck pain, -cramping, -changes in gait Ophthalmology: denies vision changes, eye redness, itching, discharge Urology: -burning with urination, -difficulty urinating, -blood in urine, -urinary frequency, -urgency, -incontinence Neurology: -headache, -weakness, -tingling, -numbness, -memory loss, -falls, -dizziness Psychology: -depressed mood, -agitation, -sleep problems     Objective:   Physical Exam  BP 140/90 (BP Location: Left Arm)   Pulse 80   Resp 16   Ht 5' 10.5" (1.791 m)   Wt 234 lb (106.1 kg)   SpO2 98%   BMI 33.10 kg/m   Wt Readings from Last 3 Encounters:  04/10/16 234 lb (106.1 kg)  01/06/16 227 lb (103 kg)  03/24/15 227 lb (103 kg)   BP Readings from Last 3 Encounters:  04/10/16 140/90  01/06/16 (!) 136/98  03/24/15 (!) 142/90    General appearance: alert, no distress, WD/WN, AA male Skin:tattoos bilat forearms,flat brown irregular lesion of upper abdomen but well defined, 4cm x 2cm roughly c/w his birthmark, no worrisome lesions HEENT: normocephalic, conjunctiva/corneas normal, sclerae anicteric, PERRLA, EOMi, nares patent, no discharge or erythema, pharynx normal Oral cavity: MMM, tongue normal, teeth with moderate plaque, otherwise in good repair Neck: supple, no lymphadenopathy, no thyromegaly, no masses, normal ROM, no bruits Chest: non tender, normal shape and expansion, right mid anterior and posterior chest wall with small surgical scars Heart: RRR, normal S1, S2, no murmurs Lungs: CTA bilaterally, no wheezes, rhonchi, or rales Abdomen: +bs, soft, non tender, non distended, no masses, no hepatomegaly, no splenomegaly, no  bruits Back: non tender, normal ROM, no scoliosis Musculoskeletal: upper extremities non tender, no obvious deformity, normal ROM throughout, lower extremities non tender, no obvious deformity, normal ROM throughout Extremities: no edema, no cyanosis, no clubbing Pulses: 2+ symmetric, upper and lower extremities, normal cap refill Neurological: alert, oriented x 3, CN2-12 intact, strength normal upper extremities and lower extremities, sensation normal throughout, DTRs 2+ throughout, no cerebellar signs, gait normal Psychiatric: normal affect, behavior normal, pleasant  GU: normal male external genitalia, circumcised, nontender, no masses, no hernia, no lymphadenopathy Rectal: normal rectal tone, prostate moderately enlarged, no nodules   Adult ECG Report  Indication: chest pain, physical  Rate: 82 bpm  Rhythm: normal sinus rhythm and sinus arrhythmia  QRS Axis: -15 degrees  PR Interval: 181ms  QRS Duration: 60ms  QTc: 43ms  Conduction Disturbances: none  Other Abnormalities: nonspecific T wave abnormality in III, RR' in V2  Patient's cardiac risk  factors are: advanced age (older than 19 for men, 42 for women), dyslipidemia, hypertension and male gender.  EKG comparison: none  Narrative Interpretation: nonspecific T wave abnormality      Assessment and Plan :    Encounter Diagnoses  Name Primary?  . Annual physical exam Yes  . Essential hypertension   . Gastroesophageal reflux disease without esophagitis   . Bruxism, sleep-related   . Erectile dysfunction, unspecified erectile dysfunction type   . Snoring   . Hypersomnia with sleep apnea   . RLS (restless legs syndrome)   . Former smoker   . Dyslipidemia   . Screening for prostate cancer   . Need for Tdap vaccination   . Vaccine counseling   . Macular degeneration   . Chronic kidney disease, stage 3 (moderate)   . Need for hepatitis C screening test   . Obesity   . Chest pain, unspecified chest pain type      Physical exam - discussed healthy lifestyle, diet, exercise, preventative care, vaccinations, and addressed their concerns.   See your dentist yearly for routine dental care including hygiene visits twice yearly. See your eye doctor yearly for routine vision care. Routine labs today. We will request copy of last colonoscopy from Dr. Benson Norway  Vaccinations: Advised yearly flu shot Counseled on the Tdap (tetanus, diptheria, and acellular pertussis) vaccine.  Vaccine information sheet given. Tdap vaccine given after consent obtained.  Other concerns today: HTN - pending labs, will need to make modifications to regimen hyperlipidemia - labs today, c/t same medication  Chest pain - reviewed 06/2009 nuclear stress test results that was normal.  EF 62%, Dr. Quay Burow.  Pending labs, consider next steps.  His few episodes may have been due to extreme heat and activity, but he does have some significant family history including his own cardiac risk factors.  Follow up pending labs

## 2016-04-11 ENCOUNTER — Other Ambulatory Visit: Payer: Self-pay | Admitting: Medical

## 2016-04-11 DIAGNOSIS — R079 Chest pain, unspecified: Secondary | ICD-10-CM

## 2016-04-11 LAB — HEPATITIS C ANTIBODY: HCV Ab: NEGATIVE

## 2016-04-11 LAB — MICROALBUMIN / CREATININE URINE RATIO
CREATININE, URINE: 346 mg/dL (ref 20–370)
Microalb Creat Ratio: 1 mcg/mg creat (ref <30)
Microalb, Ur: 0.5 mg/dL

## 2016-04-11 LAB — HEMOGLOBIN A1C
HEMOGLOBIN A1C: 5.8 % — AB (ref <5.7)
Mean Plasma Glucose: 120 mg/dL

## 2016-04-11 LAB — PSA: PSA: 0.5 ng/mL (ref <=4.0)

## 2016-04-11 MED ORDER — LOSARTAN POTASSIUM-HCTZ 50-12.5 MG PO TABS: 1.0000 | ORAL_TABLET | Freq: Every day | ORAL | 3 refills | Status: DC

## 2016-04-11 MED ORDER — SILDENAFIL CITRATE 20 MG PO TABS: ORAL_TABLET | ORAL | 5 refills | Status: DC

## 2016-04-11 MED ORDER — OMEPRAZOLE 40 MG PO CPDR: 40.0000 mg | DELAYED_RELEASE_CAPSULE | Freq: Every day | ORAL | 3 refills | Status: DC

## 2016-04-11 MED ORDER — CARVEDILOL 6.25 MG PO TABS: 6.2500 mg | ORAL_TABLET | Freq: Two times a day (BID) | ORAL | 3 refills | Status: DC

## 2016-04-11 MED ORDER — SIMVASTATIN 40 MG PO TABS: 40.0000 mg | ORAL_TABLET | Freq: Every day | ORAL | 3 refills | Status: DC

## 2016-04-27 ENCOUNTER — Telehealth: Payer: Self-pay

## 2016-04-27 NOTE — Telephone Encounter (Signed)
Colonoscopy report rcvd. Placed in your folder for review. Dave Carpenter

## 2016-05-01 DIAGNOSIS — R079 Chest pain, unspecified: Secondary | ICD-10-CM | POA: Insufficient documentation

## 2016-05-02 ENCOUNTER — Ambulatory Visit (INDEPENDENT_AMBULATORY_CARE_PROVIDER_SITE_OTHER): Payer: Managed Care, Other (non HMO) | Admitting: Interventional Cardiology

## 2016-05-02 ENCOUNTER — Encounter: Payer: Self-pay | Admitting: Interventional Cardiology

## 2016-05-02 VITALS — BP 144/92 | HR 80 | Ht 72.0 in | Wt 236.4 lb

## 2016-05-02 DIAGNOSIS — N183 Chronic kidney disease, stage 3 unspecified: Secondary | ICD-10-CM

## 2016-05-02 DIAGNOSIS — G4710 Hypersomnia, unspecified: Secondary | ICD-10-CM

## 2016-05-02 DIAGNOSIS — I10 Essential (primary) hypertension: Secondary | ICD-10-CM | POA: Diagnosis not present

## 2016-05-02 DIAGNOSIS — R0789 Other chest pain: Secondary | ICD-10-CM | POA: Diagnosis not present

## 2016-05-02 DIAGNOSIS — E785 Hyperlipidemia, unspecified: Secondary | ICD-10-CM

## 2016-05-02 DIAGNOSIS — G4730 Sleep apnea, unspecified: Secondary | ICD-10-CM

## 2016-05-02 NOTE — Patient Instructions (Addendum)
Medication Instructions:  Your physician recommends that you continue on your current medications as directed. Please refer to the Current Medication list given to you today.   Labwork: None   Testing/Procedures: Your physician has requested that you have an echocardiogram. Echocardiography is a painless test that uses sound waves to create images of your heart. It provides your doctor with information about the size and shape of your heart and how well your heart's chambers and valves are working. This procedure takes approximately one hour. There are no restrictions for this procedure.  Schedule an appointment for chest CTA  with contrast.  Follow-Up: Your physician recommends that you schedule a follow-up appointment as needed with Dr Tamala Julian.        If you need a refill on your cardiac medications before your next appointment, please call your pharmacy.

## 2016-05-02 NOTE — Progress Notes (Signed)
Cardiology Office Note    Date:  05/02/2016   ID:  Confesor, Capel [birth year], MRN [id number]  PCP:  Crisoforo Oxford, PA-C  Cardiologist: Sinclair Grooms, MD   Chief Complaint  Patient presents with  . Chest Pain    History of Present Illness:  Dave Carpenter is a 60 y.o. male with prior smoking history referred for recurring chest pain. He has a history of hypertension.  The patient has a two-month history of recurring chest discomfort. It occurs usually in the left subcostal region and can radiate around the rib cage to his back. When the discomfort is present he is not able to take a deep breath because of the pain intensifies. The discomfort is not precipitated by physical activity. He has not noticed a rash on his chest or in the axillary region. He denies palpitations. There is no orthopnea or PND. 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 Arlyce Dice in 2005. He also has history of a large lipoma in the upper lumbar and lower thoracic spine region that is palpable on exam.  Past Medical History:  Diagnosis Date  . Allergy   . Arthritis    knees, shouders, wrists  . Birth mark    upper abdomen  . Erectile dysfunction   . GERD (gastroesophageal reflux disease)   . H/O cardiovascular stress test 06/2009   normal nuclear stress test, Dr. Quay Burow  . History of seizure    few times as a child, none as an adult  . Hyperlipidemia   . Hypertension   . Lipoma of back    since 2007 or earlier  . Wears glasses     Past Surgical History:  Procedure Laterality Date  . COLONOSCOPY  2011   Dr. Benson Norway  . ganglion cyst removal    . right lung surgery to remove mass  2007   benign  . TONSILLECTOMY      Current Medications: Outpatient Medications Prior to Visit  Medication Sig Dispense Refill  . carvedilol (COREG) 6.25 MG tablet Take 1 tablet (6.25 mg total) by mouth 2 (two) times daily with a meal. Reported on 01/06/2016 180 tablet 3  .  losartan-hydrochlorothiazide (HYZAAR) 50-12.5 MG tablet Take 1 tablet by mouth daily. 30 tablet 3  . omeprazole (PRILOSEC) 40 MG capsule Take 1 capsule (40 mg total) by mouth daily. 90 capsule 3  . simvastatin (ZOCOR) 40 MG tablet Take 1 tablet (40 mg total) by mouth daily. Reported on 01/06/2016 90 tablet 3  . sildenafil (REVATIO) 20 MG tablet 2-5 tablets daily prn (Patient not taking: Reported on 05/02/2016) 50 tablet 5   No facility-administered medications prior to visit.      Allergies:   Aspirin   Social History   Social History  . Marital status: Single    Spouse name: N/A  . Number of children: N/A  . Years of education: N/A   Occupational History  . [employer]    Social History Main Topics  . Smoking status: Former Smoker    Packs/day: 1.00    Years: 30.00    Quit date: 01/06/2003  . Smokeless tobacco: Never Used  . Alcohol use No  . Drug use: No  . Sexual activity: Not Asked   Other Topics Concern  . 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   No particular diet discretion.  Not attending church.  As of 04/2016.     Family History:  The patient's family history includes Diabetes in his father and mother; Heart disease in his brother and sister; Other in his mother; Stroke in his father and mother.   ROS:   Please see the history of present illness.    Snoring, headaches, back pain, muscle pain, cough, shortness of breath when lying, waking up from sleep short of breath, and chest pain.  All other systems reviewed and are negative.   PHYSICAL EXAM:   VS:  BP (!) 144/92   Pulse 80   Ht 6' (1.829 m)   Wt 236 lb 6.4 oz (107.2 kg)   BMI 32.06 kg/m    GEN: Well nourished, well developed, in no acute distress  HEENT: normal  Neck: no JVD, carotid bruits, or masses Cardiac: RRR; no murmurs, rubs, or gallops,no edema  Respiratory:  clear to auscultation bilaterally, normal work of breathing GI: soft,  nontender, nondistended, + BS MS: no deformity or atrophy  Skin: warm and dry, no rash Neuro:  Alert and Oriented x 3, Strength and sensation are intact Psych: euthymic mood, full affect  Wt Readings from Last 3 Encounters:  05/02/16 236 lb 6.4 oz (107.2 kg)  04/10/16 234 lb (106.1 kg)  01/06/16 227 lb (103 kg)      Studies/Labs Reviewed:   EKG:  EKG  Normal when performed on 04/10/2016.  Recent Labs: 04/10/2016: ALT 26; BUN 18; Creat 1.27; Hemoglobin 15.2; Platelets 247; Potassium 4.6; Sodium 141   Lipid Panel    Component Value Date/Time   CHOL 178 04/10/2016 0759   TRIG 132 04/10/2016 0759   HDL 43 04/10/2016 0759   CHOLHDL 4.1 04/10/2016 0759   VLDL 26 04/10/2016 0759   LDLCALC 109 04/10/2016 0759    Additional studies/ records that were reviewed today include:  Reviewed prior data including the surgical note from Dr. Arlyce Dice where a right lower lobe hamartoma was removed in 2005. Advanced imaging studies have been done on the lungs.    ASSESSMENT:    1. Other chest pain   2. Essential hypertension   3. Dyslipidemia   4. Chronic kidney disease, stage 3 (moderate)   5. Hypersomnia with sleep apnea      PLAN:  In order of problems listed above:  1. Chest pain has qualities of musculoskeletal and/or inflammatory discomfort such as pleurisy or per carditis. Symptoms are not that of myocardial ischemia. Given his medical profile, lung cancer, aortic aneurysm, and inflammatory lung disease need to be excluded. A chest CT with and without contrast will be performed to exclude aortic aneurysm, pleural effusion, and pericardial thickening. If evidence of inflammation is present, he will need to have a serologic workup for October a meal in disease. 2. Borderline control. Echocardiogram will be done to exclude hypertensive heart disease. If LVH or evidence of end organ damage, we will need to intensify antihypertensive regimen. This will also help exclude paracardial effusion  and regional wall motion abnormality that may redirect our attention to coronary disease to explain the patient's chest pain.    Medication Adjustments/Labs and Tests Ordered: Current medicines are reviewed at length with the patient today.  Concerns regarding medicines are outlined above.  Medication changes, Labs and Tests ordered today are listed in the Patient Instructions below. There are no Patient Instructions on file for this visit.   Signed, Sinclair Grooms, MD  05/02/2016 4:18 PM    [facility] [place], [place], [place]  [address] Phone: [phone]; Fax: [phone]

## 2016-05-05 DIAGNOSIS — Z9289 Personal history of other medical treatment: Secondary | ICD-10-CM

## 2016-05-05 HISTORY — DX: Personal history of other medical treatment: Z92.89

## 2016-05-05 HISTORY — PX: TRANSTHORACIC ECHOCARDIOGRAM: SHX275

## 2016-05-16 ENCOUNTER — Other Ambulatory Visit: Payer: Self-pay

## 2016-05-16 ENCOUNTER — Ambulatory Visit (HOSPITAL_COMMUNITY): Payer: Managed Care, Other (non HMO) | Attending: Cardiology

## 2016-05-16 ENCOUNTER — Ambulatory Visit (INDEPENDENT_AMBULATORY_CARE_PROVIDER_SITE_OTHER)
Admission: RE | Admit: 2016-05-16 | Discharge: 2016-05-16 | Disposition: A | Discharge status: Home / Self Care | Payer: Managed Care, Other (non HMO) | Source: Ambulatory Visit | Attending: Interventional Cardiology | Admitting: Interventional Cardiology

## 2016-05-16 DIAGNOSIS — N183 Chronic kidney disease, stage 3 unspecified: Secondary | ICD-10-CM

## 2016-05-16 DIAGNOSIS — I1310 Hypertensive heart and chronic kidney disease without heart failure, with stage 1 through stage 4 chronic kidney disease, or unspecified chronic kidney disease: Secondary | ICD-10-CM | POA: Diagnosis not present

## 2016-05-16 DIAGNOSIS — G4730 Sleep apnea, unspecified: Secondary | ICD-10-CM

## 2016-05-16 DIAGNOSIS — Z87891 Personal history of nicotine dependence: Secondary | ICD-10-CM | POA: Diagnosis not present

## 2016-05-16 DIAGNOSIS — R079 Chest pain, unspecified: Secondary | ICD-10-CM | POA: Diagnosis present

## 2016-05-16 DIAGNOSIS — E785 Hyperlipidemia, unspecified: Secondary | ICD-10-CM

## 2016-05-16 DIAGNOSIS — G4710 Hypersomnia, unspecified: Secondary | ICD-10-CM

## 2016-05-16 DIAGNOSIS — R0789 Other chest pain: Secondary | ICD-10-CM

## 2016-05-16 DIAGNOSIS — I10 Essential (primary) hypertension: Secondary | ICD-10-CM | POA: Diagnosis not present

## 2016-05-16 MED ORDER — IOPAMIDOL (ISOVUE-370) INJECTION 76%
100.0000 mL | Freq: Once | INTRAVENOUS | Status: AC | PRN
  Administered 2016-05-16: 100 mL via INTRAVENOUS

## 2016-06-04 NOTE — Progress Notes (Signed)
Cardiology Office Note    Date:  06/06/2016   ID:  Arvey, Lader 02/10/1956, MRN [id number]  PCP:  Crisoforo Oxford, PA-C  Cardiologist: Sinclair Grooms, MD   Chief Complaint  Patient presents with  . Chest Pain    History of Present Illness:  Dave Carpenter is a 60 y.o. male chest discomfort radiating from under the left shoulder blade around to the left parasternal area.  Recent cardiac workup including a chest CT and echocardiogram were unrevealing for evidence of paracardial effusion, significant left ventricular hypertrophy, pulmonary hypertension, and coronary artery plaque. Since the discomfort is nonexertional we have not done an ischemic evaluation. Given absence of calcium on the CT, I do not believe this is necessary.  He does have a large lipoma in the mid thoracic area. Perhaps there is some irritation on the subcostal nerves that arises from the thoracic spine. Perhaps this is causing radiation of pain around the chest wall.  Chest CT did demonstrate thickened esophagus. He is seeing Dr. Benson Norway and his had endoscopy within the past year and was started on Prilosec. He also states that he needed an esophageal dilatation.  Past Medical History:  Diagnosis Date  . Allergy   . Arthritis    knees, shouders, wrists  . Birth mark    upper abdomen  . Erectile dysfunction   . GERD (gastroesophageal reflux disease)   . H/O cardiovascular stress test 06/2009   normal nuclear stress test, Dr. Quay Burow  . History of seizure    few times as a child, none as an adult  . Hyperlipidemia   . Hypertension   . Lipoma of back    since 2007 or earlier  . Wears glasses     Past Surgical History:  Procedure Laterality Date  . COLONOSCOPY  2011   Dr. Benson Norway  . ganglion cyst removal    . right lung surgery to remove mass  2007   benign  . TONSILLECTOMY      Current Medications: Outpatient Medications Prior to Visit  Medication Sig Dispense Refill  .  carvedilol (COREG) 6.25 MG tablet Take 1 tablet (6.25 mg total) by mouth 2 (two) times daily with a meal. Reported on 01/06/2016 180 tablet 3  . losartan-hydrochlorothiazide (HYZAAR) 50-12.5 MG tablet Take 1 tablet by mouth daily. 30 tablet 3  . omeprazole (PRILOSEC) 40 MG capsule Take 1 capsule (40 mg total) by mouth daily. 90 capsule 3  . sildenafil (REVATIO) 20 MG tablet Take two to five (2 -5) tablets (40 - 100 mg total) by mouth daily as needed for erectile dysfunction.    . simvastatin (ZOCOR) 40 MG tablet Take 1 tablet (40 mg total) by mouth daily. Reported on 01/06/2016 90 tablet 3   No facility-administered medications prior to visit.      Allergies:   Aspirin   Social History   Social History  . Marital status: Single    Spouse name: N/A  . Number of children: N/A  . Years of education: N/A   Occupational History  . [employer]    Social History Main Topics  . Smoking status: Former Smoker    Packs/day: 1.00    Years: 30.00    Quit date: 01/06/2003  . Smokeless tobacco: Never Used  . Alcohol use No  . Drug use: No  . Sexual activity: Not Asked   Other Topics Concern  . 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   No particular diet discretion.   Not attending church.  As of 04/2016.     Family History:  The patient's family history includes Diabetes in his father and mother; Heart disease in his brother and sister; Other in his mother; Stroke in his father and mother.   ROS:   Please see the history of present illness.    No complaints other than discomfort in the region of the large lymphoma overlying the lower thoracic vertebrae.  All other systems reviewed and are negative.   PHYSICAL EXAM:   VS:  BP 130/86   Pulse 88   Ht 6' (1.829 m)   Wt 238 lb 3.2 oz (108 kg)   BMI 32.31 kg/m    GEN: Well nourished, well developed, in no acute distress  HEENT: normal  Neck: no JVD, carotid bruits, or  masses Cardiac: RRR; no murmurs, rubs, or gallops,no edema  Respiratory:  clear to auscultation bilaterally, normal work of breathing GI: soft, nontender, nondistended, + BS MS: no deformity or atrophy  Skin: warm and dry, no rash Neuro:  Alert and Oriented x 3, Strength and sensation are intact Psych: euthymic mood, full affect  Wt Readings from Last 3 Encounters:  06/06/16 238 lb 3.2 oz (108 kg)  05/02/16 236 lb 6.4 oz (107.2 kg)  04/10/16 234 lb (106.1 kg)      Studies/Labs Reviewed:   EKG:  EKG  No new data  Recent Labs: 04/10/2016: ALT 26; BUN 18; Creat 1.27; Hemoglobin 15.2; Platelets 247; Potassium 4.6; Sodium 141   Lipid Panel    Component Value Date/Time   CHOL 178 04/10/2016 0759   TRIG 132 04/10/2016 0759   HDL 43 04/10/2016 0759   CHOLHDL 4.1 04/10/2016 0759   VLDL 26 04/10/2016 0759   LDLCALC 109 04/10/2016 0759    Additional studies/ records that were reviewed today include:  Echocardiogram 2017 Study Conclusions  - Left ventricle: The cavity size was normal. Wall thickness was   increased in a pattern of mild LVH. Systolic function was normal.   The estimated ejection fraction was in the range of 55% to 60%.   Wall motion was normal; there were no regional wall motion   abnormalities. Doppler parameters are consistent with abnormal   left ventricular relaxation (grade 1 diastolic dysfunction).  Impressions:  - Normal LV systolic function; grade 1 diastolic dysfunction.   CT Chest 2017: IMPRESSION: Aortic atherosclerosis without aneurysm or dissection.  Wall thickening of distal esophagus could be related to reflux or esophagitis though tumor not excluded with this appearance; consider follow-up esophagram or endoscopy to assess.  Subsegmental atelectasis RIGHT lower lobe.   ASSESSMENT:    1. Precordial pain   2. Former smoker   3. Esophageal thickening   4. Dyslipidemia      PLAN:  In order of problems listed  above:  1. Etiology is uncertain. Suspect this is related to musculoskeletal/neurogenic discomfort. Does not sound ischemic and no further workup is felt indicated at this time. 2. This problem no longer exists. He denies tobacco use. 3. This polyp has been evaluated by Dr. Benson Norway in the past. He has had esophageal dilatation and endoscopy within the past 12 months. 4. Try to keep LDL cholesterol less than 100.    Medication Adjustments/Labs and Tests Ordered: Current medicines are reviewed at length with the patient today.  Concerns regarding medicines are outlined above.  Medication changes, Labs and Tests ordered today  are listed in the Patient Instructions below. Patient Instructions  Medication Instructions:  Your physician recommends that you continue on your current medications as directed. Please refer to the Current Medication list given to you today.   Labwork: None ordered  Testing/Procedures: None ordered  Follow-Up: Your physician wants you to follow-up as needed with Dr. Tamala Julian.   Any Other Special Instructions Will Be Listed Below (If Applicable).     If you need a refill on your cardiac medications before your next appointment, please call your pharmacy.      Signed, Sinclair Grooms, MD  06/06/2016 1:32 PM    [facility] [place], [place], [place]  [address] Phone: [phone]; Fax: [phone]

## 2016-06-06 ENCOUNTER — Encounter: Payer: Self-pay | Admitting: Interventional Cardiology

## 2016-06-06 ENCOUNTER — Ambulatory Visit (INDEPENDENT_AMBULATORY_CARE_PROVIDER_SITE_OTHER): Payer: Managed Care, Other (non HMO) | Admitting: Interventional Cardiology

## 2016-06-06 VITALS — BP 130/86 | HR 88 | Ht 72.0 in | Wt 238.2 lb

## 2016-06-06 DIAGNOSIS — K2289 Other specified disease of esophagus: Secondary | ICD-10-CM

## 2016-06-06 DIAGNOSIS — K228 Other specified diseases of esophagus: Secondary | ICD-10-CM | POA: Diagnosis not present

## 2016-06-06 DIAGNOSIS — E785 Hyperlipidemia, unspecified: Secondary | ICD-10-CM | POA: Diagnosis not present

## 2016-06-06 DIAGNOSIS — Z87891 Personal history of nicotine dependence: Secondary | ICD-10-CM

## 2016-06-06 DIAGNOSIS — R072 Precordial pain: Secondary | ICD-10-CM | POA: Diagnosis not present

## 2016-06-06 NOTE — Patient Instructions (Signed)
Medication Instructions:  Your physician recommends that you continue on your current medications as directed. Please refer to the Current Medication list given to you today.   Labwork: None ordered  Testing/Procedures: None ordered  Follow-Up: Your physician wants you to follow-up as needed with Dr. Tamala Julian.   Any Other Special Instructions Will Be Listed Below (If Applicable).     If you need a refill on your cardiac medications before your next appointment, please call your pharmacy.

## 2016-07-17 ENCOUNTER — Other Ambulatory Visit: Payer: Self-pay | Admitting: Medical

## 2016-09-04 DIAGNOSIS — R0683 Snoring: Secondary | ICD-10-CM

## 2016-09-04 DIAGNOSIS — E790 Hyperuricemia without signs of inflammatory arthritis and tophaceous disease: Secondary | ICD-10-CM

## 2016-09-04 DIAGNOSIS — R7301 Impaired fasting glucose: Secondary | ICD-10-CM

## 2016-09-04 HISTORY — DX: Hyperuricemia without signs of inflammatory arthritis and tophaceous disease: E79.0

## 2016-09-04 HISTORY — DX: Snoring: R06.83

## 2016-09-04 HISTORY — DX: Impaired fasting glucose: R73.01

## 2016-10-03 ENCOUNTER — Ambulatory Visit (INDEPENDENT_AMBULATORY_CARE_PROVIDER_SITE_OTHER): Payer: Managed Care, Other (non HMO) | Admitting: Medical

## 2016-10-03 ENCOUNTER — Other Ambulatory Visit: Payer: Self-pay | Admitting: Medical

## 2016-10-03 ENCOUNTER — Encounter: Payer: Self-pay | Admitting: Medical

## 2016-10-03 ENCOUNTER — Ambulatory Visit
Admission: RE | Admit: 2016-10-03 | Discharge: 2016-10-03 | Disposition: A | Discharge status: Home / Self Care | Payer: Managed Care, Other (non HMO) | Source: Ambulatory Visit | Attending: Medical | Admitting: Medical

## 2016-10-03 VITALS — BP 118/80 | HR 101 | Temp 99.4°F | Wt 240.2 lb

## 2016-10-03 DIAGNOSIS — R52 Pain, unspecified: Secondary | ICD-10-CM

## 2016-10-03 DIAGNOSIS — R6889 Other general symptoms and signs: Secondary | ICD-10-CM

## 2016-10-03 DIAGNOSIS — R05 Cough: Secondary | ICD-10-CM

## 2016-10-03 DIAGNOSIS — R059 Cough, unspecified: Secondary | ICD-10-CM

## 2016-10-03 LAB — POC INFLUENZA A&B (BINAX/QUICKVUE)
INFLUENZA B, POC: NEGATIVE
Influenza A, POC: NEGATIVE

## 2016-10-03 MED ORDER — HYDROCODONE-HOMATROPINE 5-1.5 MG/5ML PO SYRP: 5.0000 mL | ORAL_SOLUTION | Freq: Three times a day (TID) | ORAL | 0 refills | Status: DC | PRN

## 2016-10-03 MED ORDER — BENZONATATE 200 MG PO CAPS: 200.0000 mg | ORAL_CAPSULE | Freq: Three times a day (TID) | ORAL | 0 refills | Status: DC | PRN

## 2016-10-03 MED ORDER — OSELTAMIVIR PHOSPHATE 75 MG PO CAPS: 75.0000 mg | ORAL_CAPSULE | Freq: Two times a day (BID) | ORAL | 0 refills | Status: DC

## 2016-10-03 NOTE — Progress Notes (Signed)
  Subjective:  Dave Carpenter is a 61 y.o. male who presents for illness.  Symptoms include 1 day hx/o cough, headache,body ache,chills, has some productive mucous.   Using dimetapp. Wife has same symptoms, she sees her doctor tomorrow.   He is usually in good health.  No other aggravating or relieving factors. No other complaint.  The following portions of the patient's history were reviewed and updated as appropriate: allergies, current medications, past medical history, past social history and problem list.  ROS as in subjective   Past Medical History:  Diagnosis Date  . Allergy   . Arthritis    knees, shouders, wrists  . Birth mark    upper abdomen  . Erectile dysfunction   . GERD (gastroesophageal reflux disease)   . H/O cardiovascular stress test 06/2009   normal nuclear stress test, Dr. Quay Burow  . History of seizure    few times as a child, none as an adult  . Hyperlipidemia   . Hypertension   . Lipoma of back    since 2007 or earlier  . Wears glasses      Objective: BP 118/80   Pulse (!) 101   Temp 99.4 F (37.4 C)   Wt 240 lb 3.2 oz (109 kg)   SpO2 97%   BMI 32.58 kg/m   General: somewhat Ill-appearing, well-developed, well-nourished Skin: warm, dry HEENT: Nose inflamed and congested, clear conjunctiva, TMs pearly, no sinus tenderness, pharynx with erythema, no exudates Neck: Supple, non tender, shotty cervical adenopathy Heart: Regular rate and rhythm, normal S1, S2, no murmurs Lungs: Clear to auscultation bilaterally, no wheezes, rales, rhonchi Extremities: Mild generalized tenderness   Assessment: Encounter Diagnoses  Name Primary?  . Flu-like symptoms Yes  . Cough   . Body aches      Plan: symptoms and exam suggest influenza, but can't rule out pneumonia.   Will send for xray.  Discussed diagnosis of influenza. Discussed supportive care including rest, hydration, OTC Tylenol or NSAID for fever, aches, and malaise.  Discussed period of  contagion, self quarantine at home away from others to avoid spread of disease, discussed means of transmission, and possible complications including pneumonia.  If worse or not improving within the next 4-5 days, then call or return.  Patient voiced understanding of diagnosis, recommendations, and treatment plan.

## 2016-12-25 ENCOUNTER — Telehealth: Payer: Self-pay

## 2016-12-25 ENCOUNTER — Other Ambulatory Visit: Payer: Self-pay | Admitting: Medical

## 2016-12-25 NOTE — Telephone Encounter (Signed)
Already filled

## 2016-12-25 NOTE — Telephone Encounter (Signed)
Pt needs 90 day supply of losartan-HCTZ sent to [facility] on [address]. Dave Carpenter

## 2016-12-26 ENCOUNTER — Telehealth: Payer: Self-pay | Admitting: Medical

## 2016-12-26 ENCOUNTER — Other Ambulatory Visit: Payer: Self-pay | Admitting: Medical

## 2016-12-26 MED ORDER — LOSARTAN POTASSIUM-HCTZ 50-12.5 MG PO TABS: 1.0000 | ORAL_TABLET | Freq: Every day | ORAL | 1 refills | Status: DC

## 2016-12-26 NOTE — Telephone Encounter (Signed)
Fax from [facility] [phone]  Per pharmacy note  Insurance will only pay for 90 day supply

## 2016-12-26 NOTE — Telephone Encounter (Signed)
I sent 90 day supply

## 2017-04-02 ENCOUNTER — Telehealth: Payer: Self-pay | Admitting: Medical

## 2017-04-02 ENCOUNTER — Other Ambulatory Visit: Payer: Self-pay

## 2017-04-02 MED ORDER — LOSARTAN POTASSIUM-HCTZ 50-12.5 MG PO TABS: 1.0000 | ORAL_TABLET | Freq: Every day | ORAL | 0 refills | Status: DC

## 2017-04-02 NOTE — Telephone Encounter (Signed)
Recv'd fax from [facility] requesting 90 day supply of Losartan/HCTZ

## 2017-04-02 NOTE — Telephone Encounter (Signed)
Made an appt for pt ad sent in refill to pharmacy

## 2017-04-14 ENCOUNTER — Other Ambulatory Visit: Payer: Self-pay | Admitting: Medical

## 2017-04-17 ENCOUNTER — Encounter: Payer: Self-pay | Admitting: Medical

## 2017-04-17 ENCOUNTER — Ambulatory Visit (INDEPENDENT_AMBULATORY_CARE_PROVIDER_SITE_OTHER): Payer: 59 | Admitting: Medical

## 2017-04-17 VITALS — BP 124/72 | HR 82 | Ht 70.0 in | Wt 238.0 lb

## 2017-04-17 DIAGNOSIS — Z125 Encounter for screening for malignant neoplasm of prostate: Secondary | ICD-10-CM | POA: Diagnosis not present

## 2017-04-17 DIAGNOSIS — I10 Essential (primary) hypertension: Secondary | ICD-10-CM | POA: Diagnosis not present

## 2017-04-17 DIAGNOSIS — Z7189 Other specified counseling: Secondary | ICD-10-CM

## 2017-04-17 DIAGNOSIS — Z6832 Body mass index (BMI) 32.0-32.9, adult: Secondary | ICD-10-CM

## 2017-04-17 DIAGNOSIS — R208 Other disturbances of skin sensation: Secondary | ICD-10-CM | POA: Insufficient documentation

## 2017-04-17 DIAGNOSIS — Z1211 Encounter for screening for malignant neoplasm of colon: Secondary | ICD-10-CM | POA: Diagnosis not present

## 2017-04-17 DIAGNOSIS — N529 Male erectile dysfunction, unspecified: Secondary | ICD-10-CM | POA: Diagnosis not present

## 2017-04-17 DIAGNOSIS — H3530 Unspecified macular degeneration: Secondary | ICD-10-CM | POA: Diagnosis not present

## 2017-04-17 DIAGNOSIS — K219 Gastro-esophageal reflux disease without esophagitis: Secondary | ICD-10-CM

## 2017-04-17 DIAGNOSIS — E669 Obesity, unspecified: Secondary | ICD-10-CM | POA: Diagnosis not present

## 2017-04-17 DIAGNOSIS — Z87891 Personal history of nicotine dependence: Secondary | ICD-10-CM | POA: Diagnosis not present

## 2017-04-17 DIAGNOSIS — E785 Hyperlipidemia, unspecified: Secondary | ICD-10-CM | POA: Diagnosis not present

## 2017-04-17 DIAGNOSIS — Z7185 Encounter for immunization safety counseling: Secondary | ICD-10-CM

## 2017-04-17 DIAGNOSIS — Z0000 Encounter for general adult medical examination without abnormal findings: Secondary | ICD-10-CM

## 2017-04-17 LAB — CBC WITH DIFFERENTIAL/PLATELET
BASOS ABS: 52 {cells}/uL (ref 0–200)
BASOS PCT: 1 %
Eosinophils Absolute: 104 cells/uL (ref 15–500)
Eosinophils Relative: 2 %
HCT: 43.8 % (ref 38.5–50.0)
HEMOGLOBIN: 14.9 g/dL (ref 13.2–17.1)
LYMPHS PCT: 40 %
Lymphs Abs: 2080 cells/uL (ref 850–3900)
MCH: 32 pg (ref 27.0–33.0)
MCHC: 34 g/dL (ref 32.0–36.0)
MCV: 94 fL (ref 80.0–100.0)
MPV: 9.5 fL (ref 7.5–12.5)
Monocytes Absolute: 520 cells/uL (ref 200–950)
Monocytes Relative: 10 %
Neutro Abs: 2444 cells/uL (ref 1500–7800)
Neutrophils Relative %: 47 %
Platelets: 268 10*3/uL (ref 140–400)
RBC: 4.66 MIL/uL (ref 4.20–5.80)
RDW: 13.8 % (ref 11.0–15.0)
WBC: 5.2 10*3/uL (ref 4.0–10.5)

## 2017-04-17 LAB — TSH: TSH: 1.71 m[IU]/L (ref 0.40–4.50)

## 2017-04-17 NOTE — Patient Instructions (Addendum)
Recommendations:  See your eye doctor yearly for routine vision care.  See your dentist yearly for routine dental care including hygiene visits twice yearly.  We will call with lab results  You are due for colonscopy this fall.  Call back with [place] in the fall to set up consult  Get a flu shot yearly  I recommend you have a shingles vaccine to help prevent shingles or herpes zoster outbreak.   Please call your insurer to inquire about coverage for the Shingrix vaccine given in 2 doses.   Some insurers cover this vaccine after age 73, some cover this after age 63.  If your insurer covers this, then call to schedule appointment to have this vaccine here.    Plantar Fasciitis Plantar fasciitis is a painful foot condition that affects the heel. It occurs when the band of tissue that connects the toes to the heel bone (plantar fascia) becomes irritated. This can happen after exercising too much or doing other repetitive activities (overuse injury). The pain from plantar fasciitis can range from mild irritation to severe pain that makes it difficult for you to walk or move. The pain is usually worse in the morning or after you have been sitting or lying down for a while. What are the causes? This condition may be caused by:  Standing for long periods of time.  Wearing shoes that do not fit.  Doing high-impact activities, including running, aerobics, and ballet.  Being overweight.  Having an abnormal way of walking (gait).  Having tight calf muscles.  Having high arches in your feet.  Starting a new athletic activity.  What are the signs or symptoms? The main symptom of this condition is heel pain. Other symptoms include:  Pain that gets worse after activity or exercise.  Pain that is worse in the morning or after resting.  Pain that goes away after you walk for a few minutes.  How is this diagnosed? This condition may be diagnosed based on your signs and symptoms. Your health  care provider will also do a physical exam to check for:  A tender area on the bottom of your foot.  A high arch in your foot.  Pain when you move your foot.  Difficulty moving your foot.  You may also need to have imaging studies to confirm the diagnosis. These can include:  X-rays.  Ultrasound.  MRI.  How is this treated? Treatment for plantar fasciitis depends on the severity of the condition. Your treatment may include:  Rest, ice, and over-the-counter pain medicines to manage your pain.  Exercises to stretch your calves and your plantar fascia.  A splint that holds your foot in a stretched, upward position while you sleep (night splint).  Physical therapy to relieve symptoms and prevent problems in the future.  Cortisone injections to relieve severe pain.  Extracorporeal shock wave therapy (ESWT) to stimulate damaged plantar fascia with electrical impulses. It is often used as a last resort before surgery.  Surgery, if other treatments have not worked after 12 months.  Follow these instructions at home:  Take medicines only as directed by your health care provider.  Avoid activities that cause pain.  Roll the bottom of your foot over a bag of ice or a bottle of cold water. Do this for 20 minutes, 3-4 times a day.  Perform simple stretches as directed by your health care provider.  Try wearing athletic shoes with air-sole or gel-sole cushions or soft shoe inserts.  Wear a  night splint while sleeping, if directed by your health care provider.  Keep all follow-up appointments with your health care provider. How is this prevented?  Do not perform exercises or activities that cause heel pain.  Consider finding low-impact activities if you continue to have problems.  Lose weight if you need to. The best way to prevent plantar fasciitis is to avoid the activities that aggravate your plantar fascia. Contact a health care provider if:  Your symptoms do not go  away after treatment with home care measures.  Your pain gets worse.  Your pain affects your ability to move or do your daily activities. This information is not intended to replace advice given to you by your health care provider. Make sure you discuss any questions you have with your health care provider. Document Released: 05/16/2001 Document Revised: 01/24/2016 Document Reviewed: 07/01/2014 Elsevier Interactive Patient Education  2018 [employer].     Peripheral Neuropathy Peripheral neuropathy is a type of nerve damage. It affects nerves that carry signals between the spinal cord and other parts of the body. These are called peripheral nerves. With peripheral neuropathy, one nerve or a group of nerves may be damaged. What are the causes? Many things can damage peripheral nerves. For some people with peripheral neuropathy, the cause is unknown. Some causes include:  Diabetes. This is the most common cause of peripheral neuropathy.  Injury to a nerve.  Pressure or stress on a nerve that lasts a long time.  Too little vitamin B. Alcoholism can lead to this.  Infections.  Autoimmune diseases, such as multiple sclerosis and systemic lupus erythematosus.  Inherited nerve diseases.  Some medicines, such as cancer drugs.  Toxic substances, such as lead and mercury.  Too little blood flowing to the legs.  Kidney disease.  Thyroid disease.  What are the signs or symptoms? Different people have different symptoms. The symptoms you have will depend on which of your nerves is damaged. Common symptoms include:  Loss of feeling (numbness) in the feet and hands.  Tingling in the feet and hands.  Pain that burns.  Very sensitive skin.  Weakness.  Not being able to move a part of the body (paralysis).  Muscle twitching.  Clumsiness or poor coordination.  Loss of balance.  Not being able to control your bladder.  Feeling dizzy.  Sexual problems.  How is this  diagnosed? Peripheral neuropathy is a symptom, not a disease. Finding the cause of peripheral neuropathy can be hard. To figure that out, your health care provider will take a medical history and do a physical exam. A neurological exam will also be done. This involves checking things affected by your brain, spinal cord, and nerves (nervous system). For example, your health care provider will check your reflexes, how you move, and what you can feel. Other types of tests may also be ordered, such as:  Blood tests.  A test of the fluid in your spinal cord.  Imaging tests, such as CT scans or an MRI.  Electromyography (EMG). This test checks the nerves that control muscles.  Nerve conduction velocity tests. These tests check how fast messages pass through your nerves.  Nerve biopsy. A small piece of nerve is removed. It is then checked under a microscope.  How is this treated?  Medicine is often used to treat peripheral neuropathy. Medicines may include: ? Pain-relieving medicines. Prescription or over-the-counter medicine may be suggested. ? Antiseizure medicine. This may be used for pain. ? Antidepressants. These also may  help ease pain from neuropathy. ? Lidocaine. This is a numbing medicine. You might wear a patch or be given a shot. ? Mexiletine. This medicine is typically used to help control irregular heart rhythms.  Surgery. Surgery may be needed to relieve pressure on a nerve or to destroy a nerve that is causing pain.  Physical therapy to help movement.  Assistive devices to help movement. Follow these instructions at home:  Only take over-the-counter or prescription medicines as directed by your health care provider. Follow the instructions carefully for any given medicines. Do not take any other medicines without first getting approval from your health care provider.  If you have diabetes, work closely with your health care provider to keep your blood sugar under  control.  If you have numbness in your feet: ? Check every day for signs of injury or infection. Watch for redness, warmth, and swelling. ? Wear padded socks and comfortable shoes. These help protect your feet.  Do not do things that put pressure on your damaged nerve.  Do not smoke. Smoking keeps blood from getting to damaged nerves.  Avoid or limit alcohol. Too much alcohol can cause a lack of B vitamins. These vitamins are needed for healthy nerves.  Develop a good support system. Coping with peripheral neuropathy can be stressful. Talk to a mental health specialist or join a support group if you are struggling.  Follow up with your health care provider as directed. Contact a health care provider if:  You have new signs or symptoms of peripheral neuropathy.  You are struggling emotionally from dealing with peripheral neuropathy.  You have a fever. Get help right away if:  You have an injury or infection that is not healing.  You feel very dizzy or begin vomiting.  You have chest pain.  You have trouble breathing. This information is not intended to replace advice given to you by your health care provider. Make sure you discuss any questions you have with your health care provider. Document Released: 08/11/2002 Document Revised: 01/27/2016 Document Reviewed: 04/28/2013 Elsevier Interactive Patient Education  2017 [employer].

## 2017-04-17 NOTE — Progress Notes (Signed)
Subjective:   HPI  Dave Carpenter is a 61 y.o. male who presents for a complete physical.  Chief Complaint  Patient presents with  . med check    med check back pain  and burning in feet    Medical care team/other doctors includes:  Burnard Enis, Camelia Eng, PA-C here for primary care  GI, Dr. [place]     Concerns: Lately having some back and feet issues.  Having some right sided back pains.   After surgery, he notes cramping if raises hands to stretch.  Now has pain that travels across to the left.  Had mass removed from lung 10 years ago.  At times for years can feel right posterior lower ribs move.   Having pains lot hot match under ball of feet under both feet.  Burning feeling from ball to heel of feet.   At times feels like somebody has fingers in his feet pulling the bones apart.   burning pain all day.   X 1.5 months.  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  Denies polyuria, polydipsia.   Doesn't feel this sensation in hands.     Doesn't drink alcohol.  Compliant with medication for BP, cholesterol, GERD.  Reviewed their medical, surgical, family, social, medication, and allergy history and updated chart as appropriate.  Past Medical History:  Diagnosis Date  . Allergy   . Arthritis    knees, shouders, wrists  . Birth mark    upper abdomen  . Erectile dysfunction   . GERD (gastroesophageal reflux disease)   . H/O cardiovascular stress test 06/2009   normal nuclear stress test, Dr. Quay Burow  . H/O echocardiogram 05/2016  . History of seizure    few times as a child, none as an adult  . Hyperlipidemia   . Hypertension   . Lipoma of back    since 2007 or earlier  . Wears glasses     Past Surgical History:  Procedure Laterality Date  . COLONOSCOPY  2008   Dr. [place]  . ganglion cyst removal    . right lung surgery to remove mass  2007   benign  . TONSILLECTOMY      Social History   Social History  . Marital status: Single    Spouse name: N/A  . Number of  children: N/A  . Years of education: N/A   Occupational History  . [employer]    Social History Main Topics  . Smoking status: Former Smoker    Packs/day: 1.00    Years: 30.00    Quit date: 01/06/2003  . Smokeless tobacco: Never Used  . Alcohol use No  . Drug use: No  . Sexual activity: Not on file   Other Topics Concern  . 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   No particular diet discretion.  04/2017    Family History  Problem Relation Age of Onset  . Diabetes Mother   . Stroke Mother   . Other Mother        brain tumor  . Stroke Father   . Diabetes Father   . Heart disease Sister   . Heart disease Brother   . Cancer Neg Hx      Current Outpatient Prescriptions:  .  carvedilol (COREG) 6.25 MG tablet, TAKE 1 TABLET (6.25 MG TOTAL) BY MOUTH 2 (TWO) TIMES DAILY WITH A MEAL. REPORTED ON 01/06/2016, Disp: 180  tablet, Rfl: 0 .  losartan-hydrochlorothiazide (HYZAAR) 50-12.5 MG tablet, Take 1 tablet by mouth daily., Disp: 90 tablet, Rfl: 0 .  omeprazole (PRILOSEC) 40 MG capsule, TAKE 1 CAPSULE (40 MG TOTAL) BY MOUTH DAILY., Disp: 90 capsule, Rfl: 0 .  simvastatin (ZOCOR) 40 MG tablet, TAKE 1 TABLET (40 MG TOTAL) BY MOUTH DAILY. REPORTED ON 01/06/2016, Disp: 90 tablet, Rfl: 0  Allergies  Allergen Reactions  . Aspirin     Upset stomach, gets hives    Review of Systems Constitutional: -fever, -chills, -sweats, -unexpected weight change, -decreased appetite, -fatigue Allergy: -sneezing, -itching, -congestion Dermatology: -changing moles, --rash, -lumps ENT: -runny nose, -ear pain, -sore throat, -hoarseness, -sinus pain, -teeth pain, - ringing in ears, -hearing loss, -nosebleeds Cardiology: -chest pain, -palpitations, -swelling, -difficulty breathing when lying flat, -waking up short of breath Respiratory: -cough, +shortness of breath, -difficulty breathing with exercise or exertion, -wheezing, -coughing  up blood Gastroenterology: -abdominal pain, -nausea, -vomiting, -diarrhea, -constipation, -blood in stool, -changes in bowel movement, -difficulty swallowing or eating Hematology: -bleeding, -bruising  Musculoskeletal: -joint aches, -muscle aches, -joint swelling, +back pain, -neck pain, -cramping, -changes in gait Ophthalmology: denies vision changes, eye redness, itching, discharge Urology: -burning with urination, -difficulty urinating, -blood in urine, -urinary frequency, -urgency, -incontinence Neurology: -headache, -weakness, +tingling, +numbness, -memory loss, -falls, -dizziness Psychology: -depressed mood, -agitation, -sleep problems     Objective:   Physical Exam  BP 124/72   Pulse 82   Ht 5\' 10"  (1.778 m)   Wt 238 lb (108 kg)   SpO2 98%   BMI 34.15 kg/m   Wt Readings from Last 3 Encounters:  04/17/17 238 lb (108 kg)  10/03/16 240 lb 3.2 oz (109 kg)  06/06/16 238 lb 3.2 oz (108 kg)   BP Readings from Last 3 Encounters:  04/17/17 124/72  10/03/16 118/80  06/06/16 130/86    General appearance: alert, no distress, WD/WN, AA male Skin:tattoos bilat forearms,flat brown irregular lesion of upper abdomen but well defined, 4cm x 2cm roughly c/w his birthmark, no worrisome lesions HEENT: normocephalic, conjunctiva/corneas normal, sclerae anicteric, PERRLA, EOMi, nares patent, no discharge or erythema, pharynx normal Oral cavity: MMM, tongue normal, teeth with moderate plaque, otherwise in good repair Neck: supple, no lymphadenopathy, no thyromegaly, no masses, normal ROM, no bruits Chest: non tender, normal shape and expansion, right mid anterior and posterior chest wall with small surgical scars Heart: RRR, normal S1, S2, no murmurs Lungs: CTA bilaterally, no wheezes, rhonchi, or rales Abdomen: +bs, soft, non tender, non distended, no masses, no hepatomegaly, no splenomegaly, no bruits Back: non tender, low central back with large 7-8cm diameter soft spongy mass c/w lipoma,  normal ROM, no scoliosis Musculoskeletal: tender in bilat volar feet plantar fascia, otherwise upper extremities non tender, no obvious deformity, normal ROM throughout, lower extremities non tender, no obvious deformity, normal ROM throughout Extremities: no edema, no cyanosis, no clubbing Pulses: 2+ symmetric, upper and lower extremities, normal cap refill Neurological: alert, oriented x 3, CN2-12 intact, strength normal upper extremities and lower extremities, sensation normal throughout, DTRs 2+ throughout, no cerebellar signs, gait normal Psychiatric: normal affect, behavior normal, pleasant  GU: normal male external genitalia, circumcised, non tender, no masses, no hernia, no lymphadenopathy Rectal: normal rectal tone, prostate moderately enlarged, no nodules   Assessment and Plan :    Encounter Diagnoses  Name Primary?  . Encounter for health maintenance examination in adult Yes  . Essential hypertension   . Gastroesophageal reflux disease without esophagitis   . Erectile dysfunction,  unspecified erectile dysfunction type   . Dyslipidemia   . Former smoker   . Macular degeneration, unspecified laterality, unspecified type   . Vaccine counseling   . Screening for prostate cancer   . Class 1 obesity with serious comorbidity and body mass index (BMI) of 32.0 to 32.9 in adult, unspecified obesity type   . Screen for colon cancer   . Burning sensation of feet     Physical exam - discussed healthy lifestyle, diet, exercise, preventative care, vaccinations, and addressed their concerns.   See your dentist yearly for routine dental care including hygiene visits twice yearly. See your eye doctor yearly for routine vision care. Routine labs today. Reviewed echocardiogram from 2017  Vaccinations: Advised yearly flu shot Advised he check insurance coverage for shingles vaccine.  Other concerns today: HTN , hyperlipemia - labs today, c/t same medicaitons  PSA and prostate screen  today  He will call back for colonoscopy referral update in fall 2018  Feet pain - likely neuropathy related to hard concrete floors but also could be related to plantar fascia.  Pending labs consider treatment for neuropathy with gabapentin and or plantar fascia recommendations  Follow up pending labs  Dave Carpenter was seen today for med check.  Diagnoses and all orders for this visit:  Encounter for health maintenance examination in adult -     Comprehensive metabolic panel -     CBC with Differential/Platelet -     Lipid panel -     Hemoglobin A1c -     PSA -     TSH -     Vitamin B12 -     Uric acid -     HIV antibody  Essential hypertension  Gastroesophageal reflux disease without esophagitis  Erectile dysfunction, unspecified erectile dysfunction type  Dyslipidemia  Former smoker  Macular degeneration, unspecified laterality, unspecified type  Vaccine counseling  Screening for prostate cancer -     PSA  Class 1 obesity with serious comorbidity and body mass index (BMI) of 32.0 to 32.9 in adult, unspecified obesity type  Screen for colon cancer  Burning sensation of feet -     PSA -     Vitamin B12 -     Uric acid -     HIV antibody

## 2017-04-18 ENCOUNTER — Other Ambulatory Visit: Payer: Self-pay | Admitting: Medical

## 2017-04-18 LAB — LIPID PANEL
CHOL/HDL RATIO: 4.4 ratio (ref <5.0)
Cholesterol: 176 mg/dL (ref <200)
HDL: 40 mg/dL — AB (ref >40)
LDL CALC: 112 mg/dL — AB (ref <100)
Triglycerides: 122 mg/dL (ref <150)
VLDL: 24 mg/dL (ref <30)

## 2017-04-18 LAB — VITAMIN B12: Vitamin B-12: 538 pg/mL (ref 200–1100)

## 2017-04-18 LAB — HEMOGLOBIN A1C
HEMOGLOBIN A1C: 5.8 % — AB (ref <5.7)
Mean Plasma Glucose: 120 mg/dL

## 2017-04-18 LAB — COMPREHENSIVE METABOLIC PANEL
ALBUMIN: 4.4 g/dL (ref 3.6–5.1)
ALK PHOS: 65 U/L (ref 40–115)
ALT: 22 U/L (ref 9–46)
AST: 21 U/L (ref 10–35)
BILIRUBIN TOTAL: 0.6 mg/dL (ref 0.2–1.2)
BUN: 20 mg/dL (ref 7–25)
CALCIUM: 9.5 mg/dL (ref 8.6–10.3)
CO2: 23 mmol/L (ref 20–32)
Chloride: 103 mmol/L (ref 98–110)
Creat: 1.48 mg/dL — ABNORMAL HIGH (ref 0.70–1.25)
GLUCOSE: 94 mg/dL (ref 65–99)
Potassium: 4.1 mmol/L (ref 3.5–5.3)
Sodium: 140 mmol/L (ref 135–146)
Total Protein: 7.5 g/dL (ref 6.1–8.1)

## 2017-04-18 LAB — HIV ANTIBODY (ROUTINE TESTING W REFLEX): HIV 1&2 Ab, 4th Generation: NONREACTIVE

## 2017-04-18 LAB — PSA: PSA: 0.5 ng/mL (ref <=4.0)

## 2017-04-18 LAB — URIC ACID: Uric Acid, Serum: 7.1 mg/dL (ref 4.0–8.0)

## 2017-04-18 MED ORDER — ROSUVASTATIN CALCIUM 10 MG PO TABS: 10.0000 mg | ORAL_TABLET | Freq: Every day | ORAL | 1 refills | Status: DC

## 2017-04-18 MED ORDER — AMLODIPINE BESYLATE 5 MG PO TABS: 5.0000 mg | ORAL_TABLET | Freq: Every day | ORAL | 2 refills | Status: DC

## 2017-04-18 MED ORDER — CARVEDILOL 6.25 MG PO TABS: 6.2500 mg | ORAL_TABLET | Freq: Two times a day (BID) | ORAL | 3 refills | Status: DC

## 2017-04-27 ENCOUNTER — Telehealth: Payer: Self-pay | Admitting: Medical

## 2017-04-27 NOTE — Telephone Encounter (Signed)
Please call patient  He called today because he was confused about options you gave and which meds to stop etc We went over all of that and he is clear on the meds  He decided her wants to try the inserts for his feet, advised him that I would have someone contact him about rx for that

## 2017-05-02 NOTE — Telephone Encounter (Signed)
  Patient called 8/24   I don't see a response to his call

## 2017-05-02 NOTE — Telephone Encounter (Signed)
I called him and he said the first day he was called about labs, he was at work, and forgot everything within 10 minutes.   But when he called back and you went over things the other day, he wrote notes and he is clear on instructions as of now.

## 2017-05-24 ENCOUNTER — Telehealth: Payer: Self-pay | Admitting: Medical

## 2017-05-24 ENCOUNTER — Other Ambulatory Visit: Payer: Self-pay

## 2017-05-24 MED ORDER — LOSARTAN POTASSIUM-HCTZ 50-12.5 MG PO TABS: 1.0000 | ORAL_TABLET | Freq: Every day | ORAL | 0 refills | Status: DC

## 2017-05-24 MED ORDER — AMLODIPINE BESYLATE 5 MG PO TABS: 5.0000 mg | ORAL_TABLET | Freq: Every day | ORAL | 2 refills | Status: DC

## 2017-05-24 NOTE — Telephone Encounter (Signed)
[facility]

## 2017-05-24 NOTE — Telephone Encounter (Signed)
Rcvd 90 day refill request for Amlodipine 5 mg

## 2017-05-28 ENCOUNTER — Other Ambulatory Visit: Payer: Self-pay

## 2017-05-28 ENCOUNTER — Encounter: Payer: Self-pay | Admitting: Medical

## 2017-05-28 ENCOUNTER — Ambulatory Visit (INDEPENDENT_AMBULATORY_CARE_PROVIDER_SITE_OTHER): Payer: 59 | Admitting: Medical

## 2017-05-28 VITALS — BP 134/78 | HR 82 | Wt 239.0 lb

## 2017-05-28 DIAGNOSIS — I10 Essential (primary) hypertension: Secondary | ICD-10-CM

## 2017-05-28 DIAGNOSIS — R7989 Other specified abnormal findings of blood chemistry: Secondary | ICD-10-CM | POA: Diagnosis not present

## 2017-05-28 DIAGNOSIS — N183 Chronic kidney disease, stage 3 unspecified: Secondary | ICD-10-CM

## 2017-05-28 DIAGNOSIS — Z23 Encounter for immunization: Secondary | ICD-10-CM | POA: Diagnosis not present

## 2017-05-28 DIAGNOSIS — E785 Hyperlipidemia, unspecified: Secondary | ICD-10-CM | POA: Diagnosis not present

## 2017-05-28 MED ORDER — AMLODIPINE BESYLATE 5 MG PO TABS: 5.0000 mg | ORAL_TABLET | Freq: Every day | ORAL | 0 refills | Status: DC

## 2017-05-28 NOTE — Progress Notes (Signed)
Subjective: Chief Complaint  Patient presents with  . Follow-up    follow up from b/p and kidney level   Here for f/u from 04/17/17 physical visit and labs. At that visit we had him c/t Coreg, we started Amlodipine, and reduced his Losartan HCT to 1/2 tablet daily.  Advised he avoid NSAIDs.  Last visit we started Crestor as Simvastatin was not working.    since starting the new medications, felt funny the first few days.     We advised plantar fascia splints for foot pain, plantar fascitis.    Last visit uric acid was 7.1.  Creatinine was elevated at 1.48 last visit.  Past Medical History:  Diagnosis Date  . Allergy   . Arthritis    knees, shouders, wrists  . Birth mark    upper abdomen  . Erectile dysfunction   . GERD (gastroesophageal reflux disease)   . H/O cardiovascular stress test 06/2009   normal nuclear stress test, Dr. Quay Burow  . H/O echocardiogram 05/2016  . History of seizure    few times as a child, none as an adult  . Hyperlipidemia   . Hypertension   . Lipoma of back    since 2007 or earlier  . Wears glasses    Current Outpatient Prescriptions on File Prior to Visit  Medication Sig Dispense Refill  . carvedilol (COREG) 6.25 MG tablet Take 1 tablet (6.25 mg total) by mouth 2 (two) times daily with a meal. Reported on 01/06/2016 180 tablet 3  . losartan-hydrochlorothiazide (HYZAAR) 50-12.5 MG tablet Take 1 tablet by mouth daily. 90 tablet 0  . omeprazole (PRILOSEC) 40 MG capsule TAKE 1 CAPSULE (40 MG TOTAL) BY MOUTH DAILY. (Patient not taking: Reported on 05/28/2017) 90 capsule 0   No current facility-administered medications on file prior to visit.    ROS as in subjective   Objective: BP 134/78   Pulse 82   Wt 239 lb (108.4 kg)   SpO2 98%   BMI 34.29 kg/m   BP Readings from Last 3 Encounters:  05/28/17 134/78  04/17/17 124/72  10/03/16 118/80   Wt Readings from Last 3 Encounters:  05/28/17 239 lb (108.4 kg)  04/17/17 238 lb (108 kg)   10/03/16 240 lb 3.2 oz (109 kg)   General appearance: alert, no distress, WD/WN,  Neck: supple, no lymphadenopathy, no thyromegaly, no masses Heart: RRR, normal S1, S2, no murmurs Lungs: CTA bilaterally, no wheezes, rhonchi, or rales Abdomen: +bs, soft, non tender, non distended, no masses, no hepatomegaly, no splenomegaly Pulses: 2+ symmetric, upper and lower extremities, normal cap refill Ext:no edema    Assessment: Encounter Diagnoses  Name Primary?  . Essential hypertension Yes  . Need for influenza vaccination   . Dyslipidemia   . Stage 3 chronic kidney disease   . Elevated serum creatinine     Plan: HTN - c/t same medications, amlodipine 5mg , Coreg 6.25mg  BID, Losartan HCT 50/12.5mg , 1/2 tablet daily Check BPs at home, bring readings in 2 wk dyslipidemia - compliant with Crestor, changed from simvastatin last visit.  Return for fasting labs in 2 wk CKD - labs in 2 wk Counseled on the influenza virus vaccine.  Vaccine information sheet given.  Influenza vaccine given after consent obtained.  Acen was seen today for follow-up.  Diagnoses and all orders for this visit:  Essential hypertension -     Comprehensive metabolic panel; Future -     Lipid panel; Future  Need for influenza vaccination -  Flu Vaccine QUAD 36+ mos IM  Dyslipidemia -     Comprehensive metabolic panel; Future -     Lipid panel; Future  Stage 3 chronic kidney disease -     Comprehensive metabolic panel; Future -     Lipid panel; Future  Elevated serum creatinine -     Comprehensive metabolic panel; Future -     Lipid panel; Future

## 2017-07-09 ENCOUNTER — Telehealth: Payer: Self-pay | Admitting: Internal Medicine

## 2017-07-09 ENCOUNTER — Other Ambulatory Visit: Payer: Self-pay

## 2017-07-09 MED ORDER — OMEPRAZOLE 40 MG PO CPDR: 40.0000 mg | DELAYED_RELEASE_CAPSULE | Freq: Every day | ORAL | 0 refills | Status: DC

## 2017-07-09 NOTE — Telephone Encounter (Signed)
Done

## 2017-07-09 NOTE — Telephone Encounter (Signed)
Refill request for omeprazole 40mg  #[facility] to [facility]

## 2017-07-31 ENCOUNTER — Other Ambulatory Visit: Payer: Self-pay | Admitting: Medical

## 2017-08-20 ENCOUNTER — Other Ambulatory Visit: Payer: Self-pay

## 2017-08-20 ENCOUNTER — Telehealth: Payer: Self-pay | Admitting: Family Medicine

## 2017-08-20 NOTE — Telephone Encounter (Signed)
Refill but he was suppose to come back for fasting labs.  Lets get this done.

## 2017-08-20 NOTE — Telephone Encounter (Signed)
Spoke with pt he doesn't need a refill on meds , he is coming in the morning to have labs done.

## 2017-08-20 NOTE — Telephone Encounter (Signed)
[facility] Amlod[facility] Besylate 5 mg tab #90

## 2017-08-21 ENCOUNTER — Telehealth: Payer: Self-pay | Admitting: Medical

## 2017-08-21 ENCOUNTER — Other Ambulatory Visit: Payer: 59

## 2017-08-21 DIAGNOSIS — I10 Essential (primary) hypertension: Secondary | ICD-10-CM

## 2017-08-21 DIAGNOSIS — N183 Chronic kidney disease, stage 3 unspecified: Secondary | ICD-10-CM

## 2017-08-21 DIAGNOSIS — E785 Hyperlipidemia, unspecified: Secondary | ICD-10-CM

## 2017-08-21 DIAGNOSIS — R7989 Other specified abnormal findings of blood chemistry: Secondary | ICD-10-CM

## 2017-08-21 LAB — CBC WITH DIFFERENTIAL/PLATELET
BASOS ABS: 30 {cells}/uL (ref 0–200)
BASOS PCT: 0.5 %
EOS ABS: 78 {cells}/uL (ref 15–500)
Eosinophils Relative: 1.3 %
HEMATOCRIT: 40.4 % (ref 38.5–50.0)
HEMOGLOBIN: 13.8 g/dL (ref 13.2–17.1)
LYMPHS ABS: 2040 {cells}/uL (ref 850–3900)
MCH: 31.2 pg (ref 27.0–33.0)
MCHC: 34.2 g/dL (ref 32.0–36.0)
MCV: 91.2 fL (ref 80.0–100.0)
MPV: 9.9 fL (ref 7.5–12.5)
Monocytes Relative: 9.5 %
NEUTROS ABS: 3282 {cells}/uL (ref 1500–7800)
Neutrophils Relative %: 54.7 %
Platelets: 251 10*3/uL (ref 140–400)
RBC: 4.43 10*6/uL (ref 4.20–5.80)
RDW: 12.9 % (ref 11.0–15.0)
Total Lymphocyte: 34 %
WBC mixed population: 570 cells/uL (ref 200–950)
WBC: 6 10*3/uL (ref 3.8–10.8)

## 2017-08-21 LAB — COMPREHENSIVE METABOLIC PANEL
AG Ratio: 1.5 (calc) (ref 1.0–2.5)
ALBUMIN MSPROF: 4 g/dL (ref 3.6–5.1)
ALKALINE PHOSPHATASE (APISO): 62 U/L (ref 40–115)
ALT: 25 U/L (ref 9–46)
AST: 21 U/L (ref 10–35)
BUN / CREAT RATIO: 15 (calc) (ref 6–22)
BUN: 19 mg/dL (ref 7–25)
CO2: 30 mmol/L (ref 20–32)
Calcium: 9.2 mg/dL (ref 8.6–10.3)
Chloride: 104 mmol/L (ref 98–110)
Creat: 1.3 mg/dL — ABNORMAL HIGH (ref 0.70–1.25)
GLOBULIN: 2.7 g/dL (ref 1.9–3.7)
Glucose, Bld: 115 mg/dL — ABNORMAL HIGH (ref 65–99)
Potassium: 4.2 mmol/L (ref 3.5–5.3)
SODIUM: 141 mmol/L (ref 135–146)
Total Bilirubin: 0.4 mg/dL (ref 0.2–1.2)
Total Protein: 6.7 g/dL (ref 6.1–8.1)

## 2017-08-21 LAB — LIPID PANEL
Cholesterol: 199 mg/dL (ref <200)
HDL: 42 mg/dL (ref >40)
LDL Cholesterol (Calc): 134 mg/dL (calc) — ABNORMAL HIGH
NON-HDL CHOLESTEROL (CALC): 157 mg/dL — AB (ref <130)
Total CHOL/HDL Ratio: 4.7 (calc) (ref <5.0)
Triglycerides: 121 mg/dL (ref <150)

## 2017-08-21 NOTE — Telephone Encounter (Signed)
Spoke with pt on yesterday he didn't need a refill on this

## 2017-08-21 NOTE — Telephone Encounter (Signed)
  Fax refill request  Amlodipine besylate 5 mg  90 day supply request

## 2017-08-23 ENCOUNTER — Other Ambulatory Visit: Payer: Self-pay | Admitting: Medical

## 2017-08-23 MED ORDER — ROSUVASTATIN CALCIUM 20 MG PO TABS: 20.0000 mg | ORAL_TABLET | Freq: Every day | ORAL | 1 refills | Status: DC

## 2017-08-23 MED ORDER — AMLODIPINE BESYLATE 5 MG PO TABS: 5.0000 mg | ORAL_TABLET | Freq: Every day | ORAL | 3 refills | Status: DC

## 2017-08-23 MED ORDER — LOSARTAN POTASSIUM-HCTZ 50-12.5 MG PO TABS: 1.0000 | ORAL_TABLET | Freq: Every day | ORAL | 3 refills | Status: DC

## 2017-10-13 ENCOUNTER — Other Ambulatory Visit: Payer: Self-pay | Admitting: Medical

## 2017-11-01 ENCOUNTER — Other Ambulatory Visit: Payer: Self-pay | Admitting: Medical

## 2017-11-12 ENCOUNTER — Other Ambulatory Visit: Payer: Self-pay

## 2017-11-12 ENCOUNTER — Telehealth: Payer: Self-pay | Admitting: Medical

## 2017-11-12 MED ORDER — AMLODIPINE BESYLATE 5 MG PO TABS: 5.0000 mg | ORAL_TABLET | Freq: Every day | ORAL | 0 refills | Status: DC

## 2017-11-12 NOTE — Telephone Encounter (Signed)
New Message   *STAT* If patient is at the pharmacy, call can be transferred to refill team.   1. Which medications need to be refilled? (please list name of each medication and dose if known)  amlodipine Besylate 5 mg tablet once daily  2. Which pharmacy/location (including street and city if local pharmacy) is medication to be sent to? [id number], [address], [place], [place]  3. Do they need a 30 day or 90 day supply?  90 day supply

## 2017-11-12 NOTE — Telephone Encounter (Signed)
Sent in refill

## 2017-12-12 ENCOUNTER — Telehealth: Payer: Self-pay | Admitting: Medical

## 2017-12-12 NOTE — Telephone Encounter (Signed)
I received notice from their insurer or pharmacy notifying of a recall on certain lots of Losartan blood pressure medication   Have them call pharmacy to check to see if the Losartan medication is one on the recall list   Not all versions of Losartan affected.  Keep me informed.  We can certainly switch if needed or if their lot is affected.

## 2017-12-13 NOTE — Telephone Encounter (Signed)
Called and advised with patient to go by pharmacy. Patient understood and would call us if it needed to be changed.

## 2018-01-24 ENCOUNTER — Other Ambulatory Visit: Payer: Self-pay | Admitting: Medical

## 2018-03-30 ENCOUNTER — Other Ambulatory Visit: Payer: Self-pay | Admitting: Medical

## 2018-04-25 ENCOUNTER — Other Ambulatory Visit: Payer: Self-pay | Admitting: Medical

## 2018-04-29 ENCOUNTER — Other Ambulatory Visit: Payer: Self-pay | Admitting: Medical

## 2018-05-08 ENCOUNTER — Encounter: Payer: Self-pay | Admitting: Medical

## 2018-05-08 ENCOUNTER — Ambulatory Visit (INDEPENDENT_AMBULATORY_CARE_PROVIDER_SITE_OTHER): Payer: 59 | Admitting: Medical

## 2018-05-08 VITALS — BP 130/82 | HR 70 | Temp 97.7°F | Resp 16 | Ht 70.0 in | Wt 238.4 lb

## 2018-05-08 DIAGNOSIS — Z0000 Encounter for general adult medical examination without abnormal findings: Secondary | ICD-10-CM

## 2018-05-08 DIAGNOSIS — I10 Essential (primary) hypertension: Secondary | ICD-10-CM

## 2018-05-08 DIAGNOSIS — Z1211 Encounter for screening for malignant neoplasm of colon: Secondary | ICD-10-CM

## 2018-05-08 DIAGNOSIS — R0683 Snoring: Secondary | ICD-10-CM

## 2018-05-08 DIAGNOSIS — H3530 Unspecified macular degeneration: Secondary | ICD-10-CM

## 2018-05-08 DIAGNOSIS — Z7189 Other specified counseling: Secondary | ICD-10-CM

## 2018-05-08 DIAGNOSIS — Z23 Encounter for immunization: Secondary | ICD-10-CM | POA: Diagnosis not present

## 2018-05-08 DIAGNOSIS — E790 Hyperuricemia without signs of inflammatory arthritis and tophaceous disease: Secondary | ICD-10-CM | POA: Insufficient documentation

## 2018-05-08 DIAGNOSIS — R5383 Other fatigue: Secondary | ICD-10-CM | POA: Insufficient documentation

## 2018-05-08 DIAGNOSIS — K219 Gastro-esophageal reflux disease without esophagitis: Secondary | ICD-10-CM

## 2018-05-08 DIAGNOSIS — Z87891 Personal history of nicotine dependence: Secondary | ICD-10-CM

## 2018-05-08 DIAGNOSIS — N183 Chronic kidney disease, stage 3 (moderate): Secondary | ICD-10-CM | POA: Diagnosis not present

## 2018-05-08 DIAGNOSIS — N189 Chronic kidney disease, unspecified: Secondary | ICD-10-CM

## 2018-05-08 DIAGNOSIS — N529 Male erectile dysfunction, unspecified: Secondary | ICD-10-CM

## 2018-05-08 DIAGNOSIS — Z7185 Encounter for immunization safety counseling: Secondary | ICD-10-CM

## 2018-05-08 DIAGNOSIS — E785 Hyperlipidemia, unspecified: Secondary | ICD-10-CM | POA: Diagnosis not present

## 2018-05-08 DIAGNOSIS — R7301 Impaired fasting glucose: Secondary | ICD-10-CM | POA: Diagnosis not present

## 2018-05-08 DIAGNOSIS — Z6834 Body mass index (BMI) 34.0-34.9, adult: Secondary | ICD-10-CM | POA: Insufficient documentation

## 2018-05-08 DIAGNOSIS — G2581 Restless legs syndrome: Secondary | ICD-10-CM

## 2018-05-08 LAB — POCT URINALYSIS DIP (PROADVANTAGE DEVICE)
Bilirubin, UA: NEGATIVE
GLUCOSE UA: NEGATIVE mg/dL
Ketones, POC UA: NEGATIVE mg/dL
LEUKOCYTES UA: NEGATIVE
NITRITE UA: NEGATIVE
Protein Ur, POC: NEGATIVE mg/dL
Specific Gravity, Urine: 1.025
UUROB: NEGATIVE
pH, UA: 6 (ref 5.0–8.0)

## 2018-05-08 NOTE — Patient Instructions (Addendum)
Thanks for trusting [place] with your health care and for coming in for a physical today.  Below are some general recommendations I have for you:  Yearly screenings See your eye doctor yearly for routine vision care. See your dentist yearly for routine dental care including hygiene visits twice yearly. See me here yearly for a routine physical and preventative care visit   Specific Concerns today:  . We will refer you back to Dr. Benson Norway for updated colonoscopy . Consider referral to general surgery for the mass in the lower back . Consider sleep study given snoring and fatigue . If labs are ok, we will likely refer to orthopedics for knees and feet, possible trial of Gabapentin for pain/burning sensation in feet . Shingles vaccine:  I recommend you have a shingles vaccine to help prevent shingles or herpes zoster outbreak.   Please call your insurer to inquire about coverage for the Shingrix vaccine given in 2 doses.   Some insurers cover this vaccine after age 93, some cover this after age 44.  If your insurer covers this, then call to schedule appointment to have this vaccine here.   Please follow up yearly for a physical.   Preventative Care for Adults - Male      [place]:  A routine yearly physical is a good way to check in with your primary care provider about your health and preventive screening. It is also an opportunity to share updates about your health and any concerns you have, and receive a thorough all-over exam.   Most health insurance companies pay for at least some preventative services.  Check with your health plan for specific coverages.  WHAT PREVENTATIVE SERVICES DO WOMEN NEED?  Adult men should have their weight and blood pressure checked regularly.   Men age 15 and older should have their cholesterol levels checked regularly.  Beginning at age 44 and continuing to age 65, men should be screened for colorectal cancer.  Certain people may need  continued testing until age 21.  Updating vaccinations is part of preventative care.  Vaccinations help protect against diseases such as the flu.  Osteoporosis is a disease in which the bones lose minerals and strength as we age. Men ages 14 and over should discuss this with their caregivers  Lab tests are generally done as part of preventative care to screen for anemia and blood disorders, to screen for problems with the kidneys and liver, to screen for bladder problems, to check blood sugar, and to check your cholesterol level.  Preventative services generally include counseling about diet, exercise, avoiding tobacco, drugs, excessive alcohol consumption, and sexually transmitted infections.    GENERAL RECOMMENDATIONS FOR GOOD HEALTH:  Healthy diet:  Eat a variety of foods, including fruit, vegetables, animal or vegetable protein, such as meat, fish, chicken, and eggs, or beans, lentils, tofu, and grains, such as rice.  Drink plenty of water daily.  Decrease saturated fat in the diet, avoid lots of red meat, processed foods, sweets, fast foods, and fried foods.  Exercise:  Aerobic exercise helps maintain good heart health. At least 30-40 minutes of moderate-intensity exercise is recommended. For example, a brisk walk that increases your heart rate and breathing. This should be done on most days of the week.   Find a type of exercise or a variety of exercises that you enjoy so that it becomes a part of your daily life.  Examples are running, walking, swimming, water aerobics, and biking.  For motivation  and support, explore group exercise such as aerobic class, spin class, Zumba, Yoga,or  martial arts, etc.    Set exercise goals for yourself, such as a certain weight goal, walk or run in a race such as a 5k walk/run.  Speak to your primary care provider about exercise goals.  Disease prevention:  If you smoke or chew tobacco, find out from your caregiver how to quit. It can literally  save your life, no matter how long you have been a tobacco user. If you do not use tobacco, never begin.   Maintain a healthy diet and normal weight. Increased weight leads to problems with blood pressure and diabetes.   The Body Mass Index or BMI is a way of measuring how much of your body is fat. Having a BMI above 27 increases the risk of heart disease, diabetes, hypertension, stroke and other problems related to obesity. Your caregiver can help determine your BMI and based on it develop an exercise and dietary program to help you achieve or maintain this important measurement at a healthful level.  High blood pressure causes heart and blood vessel problems.  Persistent high blood pressure should be treated with medicine if weight loss and exercise do not work.   Fat and cholesterol leaves deposits in your arteries that can block them. This causes heart disease and vessel disease elsewhere in your body.  If your cholesterol is found to be high, or if you have heart disease or certain other medical conditions, then you may need to have your cholesterol monitored frequently and be treated with medication.   Ask if you should have a cardiac stress test if your history suggests this. A stress test is a test done on a treadmill that looks for heart disease. This test can find disease prior to there being a problem.  Osteoporosis is a disease in which the bones lose minerals and strength as we age. This can result in serious bone fractures. Risk of osteoporosis can be identified using a bone density scan. Men ages 69 and over should discuss this with their caregivers. Ask your caregiver whether you should be taking a calcium supplement and Vitamin D, to reduce the rate of osteoporosis.   Avoid drinking alcohol in excess (more than two drinks per day).  Avoid use of street drugs. Do not share needles with anyone. Ask for professional help if you need assistance or instructions on stopping the use of  alcohol, cigarettes, and/or drugs.  Brush your teeth twice a day with fluoride toothpaste, and floss once a day. Good oral hygiene prevents tooth decay and gum disease. The problems can be painful, unattractive, and can cause other health problems. Visit your dentist for a routine oral and dental check up and preventive care every 6-12 months.   Look at your skin regularly.  Use a mirror to look at your back. Notify your caregivers of changes in moles, especially if there are changes in shapes, colors, a size larger than a pencil eraser, an irregular border, or development of new moles.  Safety:  Use seatbelts 100% of the time, whether driving or as a passenger.  Use safety devices such as hearing protection if you work in environments with loud noise or significant background noise.  Use safety glasses when doing any work that could send debris in to the eyes.  Use a helmet if you ride a bike or motorcycle.  Use appropriate safety gear for contact sports.  Talk to your caregiver about gun  safety.  Use sunscreen with a SPF (or skin protection factor) of 15 or greater.  Lighter skinned people are at a greater risk of skin cancer. Don't forget to also wear sunglasses in order to protect your eyes from too much damaging sunlight. Damaging sunlight can accelerate cataract formation.   Practice safe sex. Use condoms. Condoms are used for birth control and to help reduce the spread of sexually transmitted infections (or STIs).  Some of the STIs are gonorrhea (the clap), chlamydia, syphilis, trichomonas, herpes, HPV (human papilloma virus) and HIV (human immunodeficiency virus) which causes AIDS. The herpes, HIV and HPV are viral illnesses that have no cure. These can result in disability, cancer and death.   Keep carbon monoxide and smoke detectors in your home functioning at all times. Change the batteries every 6 months or use a model that plugs into the wall.   Vaccinations:  Stay up to date with your  tetanus shots and other required immunizations. You should have a booster for tetanus every 10 years. Be sure to get your flu shot every year, since 5%-20% of the [place] population comes down with the flu. The flu vaccine changes each year, so being vaccinated once is not enough. Get your shot in the fall, before the flu season peaks.   Other vaccines to consider:  Human Papilloma Virus or HPV causes cancer of the cervix, and other infections that can be transmitted from person to person. There is a vaccine for HPV, and males should get immunized between the ages of 91 and 45. It requires a series of 3 shots.   Pneumococcal vaccine to protect against certain types of pneumonia.  This is normally recommended for adults age 36 or older.  However, adults younger than 61 years old with certain underlying conditions such as diabetes, heart or lung disease should also receive the vaccine.  Shingles vaccine to protect against Varicella Zoster if you are older than age 64, or younger than 61 years old with certain underlying illness.  If you have not had the Shingrix vaccine, please call your insurer to inquire about coverage for the Shingrix vaccine given in 2 doses.   Some insurers cover this vaccine after age 29, some cover this after age 24.  If your insurer covers this, then call to schedule appointment to have this vaccine here  Hepatitis A vaccine to protect against a form of infection of the liver by a virus acquired from food.  Hepatitis B vaccine to protect against a form of infection of the liver by a virus acquired from blood or body fluids, particularly if you work in health care.  If you plan to travel internationally, check with your local health department for specific vaccination recommendations.   What should I know about cancer screening? Many types of cancers can be detected early and may often be prevented. Lung Cancer  You should be screened every year for lung cancer if: ? You are a  current smoker who has smoked for at least 30 years. ? You are a former smoker who has quit within the past 15 years.  Talk to your health care provider about your screening options, when you should start screening, and how often you should be screened.  Colorectal Cancer  Routine colorectal cancer screening usually begins at 60 years of age and should be repeated every 5-10 years until you are 61 years old. You may need to be screened more often if early forms of precancerous polyps or small  growths are found. Your health care provider may recommend screening at an earlier age if you have risk factors for colon cancer.  Your health care provider may recommend using home test kits to check for hidden blood in the stool.  A small camera at the end of a tube can be used to examine your colon (sigmoidoscopy or colonoscopy). This checks for the earliest forms of colorectal cancer.  Prostate and Testicular Cancer  Depending on your age and overall health, your health care provider may do certain tests to screen for prostate and testicular cancer.  Talk to your health care provider about any symptoms or concerns you have about testicular or prostate cancer.  Skin Cancer  Check your skin from head to toe regularly.  Tell your health care provider about any new moles or changes in moles, especially if: ? There is a change in a mole's size, shape, or color. ? You have a mole that is larger than a pencil eraser.  Always use sunscreen. Apply sunscreen liberally and repeat throughout the day.  Protect yourself by wearing long sleeves, pants, a wide-brimmed hat, and sunglasses when outside.
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
## 2018-05-09 ENCOUNTER — Encounter: Payer: Self-pay | Admitting: Gastroenterology

## 2018-05-09 ENCOUNTER — Other Ambulatory Visit: Payer: Self-pay | Admitting: Medical

## 2018-05-09 LAB — MICROALBUMIN / CREATININE URINE RATIO
CREATININE, UR: 268.3 mg/dL
MICROALBUM., U, RANDOM: 5.7 ug/mL
Microalb/Creat Ratio: 2.1 mg/g creat (ref 0.0–30.0)

## 2018-05-09 LAB — HEMOGLOBIN A1C
Est. average glucose Bld gHb Est-mCnc: 120 mg/dL
Hgb A1c MFr Bld: 5.8 % — ABNORMAL HIGH (ref 4.8–5.6)

## 2018-05-09 LAB — CBC WITH DIFFERENTIAL/PLATELET
BASOS: 0 %
Basophils Absolute: 0 10*3/uL (ref 0.0–0.2)
EOS (ABSOLUTE): 0.1 10*3/uL (ref 0.0–0.4)
EOS: 2 %
Hematocrit: 41 % (ref 37.5–51.0)
Hemoglobin: 13.9 g/dL (ref 13.0–17.7)
IMMATURE GRANS (ABS): 0 10*3/uL (ref 0.0–0.1)
IMMATURE GRANULOCYTES: 0 %
LYMPHS: 37 %
Lymphocytes Absolute: 1.7 10*3/uL (ref 0.7–3.1)
MCH: 31 pg (ref 26.6–33.0)
MCHC: 33.9 g/dL (ref 31.5–35.7)
MCV: 92 fL (ref 79–97)
Monocytes Absolute: 0.4 10*3/uL (ref 0.1–0.9)
Monocytes: 8 %
NEUTROS PCT: 53 %
Neutrophils Absolute: 2.5 10*3/uL (ref 1.4–7.0)
PLATELETS: 267 10*3/uL (ref 150–450)
RBC: 4.48 x10E6/uL (ref 4.14–5.80)
RDW: 14 % (ref 12.3–15.4)
WBC: 4.7 10*3/uL (ref 3.4–10.8)

## 2018-05-09 LAB — TESTOSTERONE: Testosterone: 333 ng/dL (ref 264–916)

## 2018-05-09 LAB — RENAL FUNCTION PANEL
ALBUMIN: 4.5 g/dL (ref 3.6–4.8)
BUN/Creatinine Ratio: 15 (ref 10–24)
BUN: 20 mg/dL (ref 8–27)
CO2: 24 mmol/L (ref 20–29)
Calcium: 9.5 mg/dL (ref 8.6–10.2)
Chloride: 101 mmol/L (ref 96–106)
Creatinine, Ser: 1.31 mg/dL — ABNORMAL HIGH (ref 0.76–1.27)
GFR, EST AFRICAN AMERICAN: 67 mL/min/{1.73_m2} (ref >59)
GFR, EST NON AFRICAN AMERICAN: 58 mL/min/{1.73_m2} — AB (ref >59)
GLUCOSE: 94 mg/dL (ref 65–99)
Phosphorus: 3.1 mg/dL (ref 2.5–4.5)
Potassium: 4.1 mmol/L (ref 3.5–5.2)
Sodium: 141 mmol/L (ref 134–144)

## 2018-05-09 LAB — LIPID PANEL
CHOL/HDL RATIO: 5.5 ratio — AB (ref 0.0–5.0)
CHOLESTEROL TOTAL: 208 mg/dL — AB (ref 100–199)
HDL: 38 mg/dL — ABNORMAL LOW (ref >39)
LDL Calculated: 144 mg/dL — ABNORMAL HIGH (ref 0–99)
TRIGLYCERIDES: 129 mg/dL (ref 0–149)
VLDL Cholesterol Cal: 26 mg/dL (ref 5–40)

## 2018-05-09 LAB — HEPATIC FUNCTION PANEL
ALK PHOS: 71 IU/L (ref 39–117)
ALT: 21 IU/L (ref 0–44)
AST: 19 IU/L (ref 0–40)
BILIRUBIN TOTAL: 0.3 mg/dL (ref 0.0–1.2)
BILIRUBIN, DIRECT: 0.09 mg/dL (ref 0.00–0.40)
Total Protein: 7.3 g/dL (ref 6.0–8.5)

## 2018-05-09 LAB — URIC ACID: Uric Acid: 7.2 mg/dL (ref 3.7–8.6)

## 2018-05-09 MED ORDER — CARVEDILOL 6.25 MG PO TABS: 6.2500 mg | ORAL_TABLET | Freq: Two times a day (BID) | ORAL | 3 refills | Status: DC

## 2018-05-09 MED ORDER — LOSARTAN POTASSIUM-HCTZ 50-12.5 MG PO TABS: 1.0000 | ORAL_TABLET | Freq: Every day | ORAL | 3 refills | Status: DC

## 2018-05-09 MED ORDER — GABAPENTIN 100 MG PO CAPS: 100.0000 mg | ORAL_CAPSULE | Freq: Every day | ORAL | 2 refills | Status: DC

## 2018-05-09 MED ORDER — AMLODIPINE BESYLATE 5 MG PO TABS: 5.0000 mg | ORAL_TABLET | Freq: Every day | ORAL | 3 refills | Status: DC

## 2018-05-09 MED ORDER — OMEPRAZOLE 40 MG PO CPDR: 40.0000 mg | DELAYED_RELEASE_CAPSULE | Freq: Every day | ORAL | 3 refills | Status: DC

## 2018-05-09 MED ORDER — ROSUVASTATIN CALCIUM 40 MG PO TABS: 40.0000 mg | ORAL_TABLET | Freq: Every day | ORAL | 3 refills | Status: DC

## 2018-05-13 ENCOUNTER — Other Ambulatory Visit: Payer: Self-pay

## 2018-05-13 DIAGNOSIS — M25561 Pain in right knee: Secondary | ICD-10-CM

## 2018-05-13 DIAGNOSIS — M25562 Pain in left knee: Principal | ICD-10-CM

## 2018-05-30 ENCOUNTER — Ambulatory Visit (INDEPENDENT_AMBULATORY_CARE_PROVIDER_SITE_OTHER): Payer: 59 | Admitting: Orthopaedic Surgery

## 2018-06-01 ENCOUNTER — Other Ambulatory Visit: Payer: Self-pay | Admitting: Medical

## 2018-06-03 NOTE — Telephone Encounter (Signed)
Is this ok to refill?  

## 2018-06-04 HISTORY — PX: COLONOSCOPY: SHX174

## 2018-06-07 ENCOUNTER — Ambulatory Visit (INDEPENDENT_AMBULATORY_CARE_PROVIDER_SITE_OTHER): Payer: Self-pay

## 2018-06-07 ENCOUNTER — Encounter: Payer: Self-pay | Admitting: *Deleted

## 2018-06-07 ENCOUNTER — Encounter (INDEPENDENT_AMBULATORY_CARE_PROVIDER_SITE_OTHER): Payer: Self-pay

## 2018-06-07 ENCOUNTER — Encounter (INDEPENDENT_AMBULATORY_CARE_PROVIDER_SITE_OTHER): Payer: Self-pay | Admitting: Orthopaedic Surgery

## 2018-06-07 ENCOUNTER — Ambulatory Visit (INDEPENDENT_AMBULATORY_CARE_PROVIDER_SITE_OTHER): Payer: 59 | Admitting: Orthopaedic Surgery

## 2018-06-07 VITALS — BP 137/96 | HR 82 | Ht 70.0 in | Wt 238.0 lb

## 2018-06-07 DIAGNOSIS — G8929 Other chronic pain: Secondary | ICD-10-CM | POA: Diagnosis not present

## 2018-06-07 DIAGNOSIS — M25562 Pain in left knee: Secondary | ICD-10-CM | POA: Diagnosis not present

## 2018-06-07 DIAGNOSIS — M25561 Pain in right knee: Secondary | ICD-10-CM | POA: Diagnosis not present

## 2018-06-07 NOTE — Progress Notes (Signed)
Office Visit Note   Patient: Dave Carpenter           Date of Birth: [birth year]           MRN: [id number] Visit Date: 06/07/2018              Requested by: Carlena Hurl, PA-C [address] [place], [place] [address] PCP: Carlena Hurl, PA-C   Assessment & Plan: Visit Diagnoses:  1. Chronic pain of left knee   2. Chronic pain of right knee     Plan: Osteoarthritis bilateral knees.  Long discussion regarding the diagnosis and treatment options.  Dave Carpenter was relieved to know that he did not "need" knee replacements.  Discussed exercises, NSAIDs, bracing ,cortisone injections and Visco supplementation.  For the moment Dave Carpenter is happy with his NSAID regimen and will return as needed  Follow-Up Instructions: Return if symptoms worsen or fail to improve.   Orders:  Orders Placed This Encounter  Procedures  . XR KNEE 3 VIEW LEFT  . XR KNEE 3 VIEW RIGHT   No orders of the defined types were placed in this encounter.     Procedures: No procedures performed   Clinical Data: No additional findings.   Subjective: Chief Complaint  Patient presents with  . Follow-up    BI LAT KNEE PAIN FOR 20 YRS.  SEEN DR 10 YRS AGO DREW FLUID OFF BOTH KNEES AND GIVEN CORTISONE INJECTION WAS TOLD WOULD NEED REPLACEMENT. SEEN PCP 1 MO AGO AND WAS REFERRED HERE  Dave Carpenter visited the office for evaluation of bilateral knee pain.  He notes that at least 10 years ago fluid was removed from his knee and cortisone injection he felt much better.  Over the years he has had some recurrent episodes of knee pain, achiness, soreness to the point of compromise.  He does work on his feet during the day and oftentimes is required to bend stoop and squat.  At one point he was told many years ago that he might have to have his knees replaced.  No recent injury or  Trauma.  No related back pain or groin discomfort.  HPI  Review of Systems  Constitutional: Positive for fatigue. Negative for fever.    HENT: Negative for ear pain.   Eyes: Negative for pain.  Respiratory: Positive for shortness of breath. Negative for cough.   Cardiovascular: Negative for leg swelling.  Gastrointestinal: Negative for constipation and diarrhea.  Genitourinary: Negative for difficulty urinating.  Musculoskeletal: Positive for back pain. Negative for neck pain.  Skin: Negative for rash.  Allergic/Immunologic: Negative for food allergies.  Neurological: Negative for weakness and numbness.  Hematological: Does not bruise/bleed easily.  Psychiatric/Behavioral: Negative for sleep disturbance.     Objective: Vital Signs: BP (!) 137/96 (BP Location: Left Arm, Patient Position: Sitting, Cuff Size: Normal)   Pulse 82   Ht 5\' 10"  (1.778 m)   Wt 238 lb (108 kg)   BMI 34.15 kg/m   Physical Exam  Constitutional: He is oriented to person, place, and time. He appears well-developed and well-nourished.  HENT:  Mouth/Throat: Oropharynx is clear and moist.  Eyes: Pupils are equal, round, and reactive to light. EOM are normal.  Pulmonary/Chest: Effort normal.  Neurological: He is alert and oriented to person, place, and time.  Skin: Skin is warm and dry.  Psychiatric: He has a normal mood and affect. His behavior is normal.    Ortho Exam awake alert and oriented x3.  Comfortable  sitting.  Examination of both knees revealed no effusion.  The knees were not hot warm or red.  Skin intact.  Some medial joint tenderness bilaterally minimal patellar crepitation.  No instability.  No lateral joint pain.  No popliteal pain.  No calf pain.  Neurovascular exam intact distally.  Full extension and over 105 degrees of flexion.  Specialty Comments:  No specialty comments available.  Imaging: Xr Knee 3 View Left  Result Date: 06/07/2018 Films of the left knee were obtained in 3 projections standing.  There is some decrease in the medial joint space with peripheral osteophytes and mild subchondral cyst formation.  No  ectopic calcification.  Some irregularity along the femoral joint surface.  Acute changes osteophytes identified at the medial patella femoral articulation patella tilt.  Films consistent with moderate osteoarthritis  Xr Knee 3 View Right  Result Date: 06/07/2018 Films of the right knee were obtained in 3 projections standing.  There is narrowing of the medial joint space of at least 50%.  Peripheral osteophytes medially and some irregularity and flattening along the medial femoral joint surface.  No obvious ectopic calcification.  Findings are not as severe as on the left but consistent with moderate osteoarthritis    PMFS History: Patient Active Problem List   Diagnosis Date Noted  . Impaired fasting blood sugar 05/08/2018  . BMI 34.0-34.9,adult 05/08/2018  . Elevated uric acid in blood 05/08/2018  . Need for influenza vaccination 05/08/2018  . Fatigue 05/08/2018  . Screen for colon cancer 04/17/2017  . Routine general medical examination at a health care facility 04/17/2017  . Burning sensation of feet 04/17/2017  . Vaccine counseling 04/10/2016  . Macular degeneration 04/10/2016  . Chronic kidney disease 04/10/2016  . Obesity 04/10/2016  . Screening for prostate cancer 04/10/2016  . Erectile dysfunction 01/06/2016  . Gastroesophageal reflux disease without esophagitis 01/06/2016  . Former smoker 01/06/2016  . Essential hypertension 01/06/2016  . Dyslipidemia 01/06/2016  . Bruxism, sleep-related 03/24/2015  . Snoring 03/24/2015  . Hypersomnia with sleep apnea 03/24/2015  . RLS (restless legs syndrome) 03/24/2015   Past Medical History:  Diagnosis Date  . Allergy   . Arthritis    knees, shouders, wrists  . Birth mark    upper abdomen  . CKD (chronic kidney disease)   . Elevated uric acid in blood 2018  . Erectile dysfunction   . GERD (gastroesophageal reflux disease)   . H/O cardiovascular stress test 06/2009   normal nuclear stress test, Dr. Quay Burow  . H/O  echocardiogram 05/2016   normal LV systolic function, EF 81-19%, grade 1 diastolic dysfunction  . History of seizure    few times as a child, none as an adult  . Hyperlipidemia   . Hypertension   . Impaired fasting blood sugar 2018  . Lipoma of back    since 2007 or earlier  . Macular degeneration   . Snoring 2018   sleep study declined due to cost  . Wears glasses     Family History  Problem Relation Age of Onset  . Diabetes Mother   . Stroke Mother   . Other Mother        brain tumor  . Stroke Father   . Diabetes Father   . Heart disease Sister   . Heart disease Brother   . Cancer Neg Hx     Past Surgical History:  Procedure Laterality Date  . COLONOSCOPY  2008   Dr. Benson [place]  . ganglion cyst removal    .  right lung surgery to remove mass  2007   benign  . TONSILLECTOMY     Social History   Occupational History  . Occupation: [employer]  Tobacco Use  . Smoking status: Former Smoker    Packs/day: 1.00    Years: 30.00    Pack years: 30.00    Last attempt to quit: 01/06/2003    Years since quitting: 15.4  . Smokeless tobacco: Never Used  Substance and Sexual Activity  . Alcohol use: No    Alcohol/week: 0.0 standard drinks  . Drug use: No  . Sexual activity: Not on file

## 2018-06-12 ENCOUNTER — Ambulatory Visit (AMBULATORY_SURGERY_CENTER): Payer: Self-pay

## 2018-06-12 ENCOUNTER — Encounter: Payer: Self-pay | Admitting: Gastroenterology

## 2018-06-12 VITALS — Ht 72.0 in | Wt 239.0 lb

## 2018-06-12 DIAGNOSIS — Z86010 Personal history of colonic polyps: Secondary | ICD-10-CM

## 2018-06-12 MED ORDER — MOVIPREP 100 G PO SOLR: 1.0000 | Freq: Once | ORAL | 0 refills | Status: AC

## 2018-06-12 NOTE — Progress Notes (Signed)
Per pt, no allergies to soy or egg products.Pt not taking any weight loss meds or using  O2 at home.  Pt refused emmi video. 

## 2018-06-26 ENCOUNTER — Encounter: Payer: Self-pay | Admitting: Gastroenterology

## 2018-06-26 ENCOUNTER — Ambulatory Visit (AMBULATORY_SURGERY_CENTER): Payer: 59 | Admitting: Gastroenterology

## 2018-06-26 VITALS — BP 110/70 | HR 75 | Temp 97.1°F | Resp 16

## 2018-06-26 DIAGNOSIS — Z86010 Personal history of colonic polyps: Secondary | ICD-10-CM

## 2018-06-26 DIAGNOSIS — Z1211 Encounter for screening for malignant neoplasm of colon: Secondary | ICD-10-CM | POA: Diagnosis not present

## 2018-06-26 MED ORDER — SODIUM CHLORIDE 0.9 % IV SOLN: 500.0000 mL | Freq: Once | INTRAVENOUS | Status: DC

## 2018-06-26 NOTE — Progress Notes (Signed)
PT taken to PACU. Monitors in place. VSS. Report given to RN. 

## 2018-06-26 NOTE — Progress Notes (Signed)
Pt's states no medical or surgical changes since previsit or office visit. 

## 2018-06-26 NOTE — Op Note (Signed)
[facility] Patient Name: Dave Carpenter Procedure Date: 06/26/2018 11:21 AM MRN: [id number] Endoscopist: Jackquline Denmark , MD Age: 62 Referring MD:  Date of Birth: [birth year] Gender: Male Account #: [id number] Procedure:                Colonoscopy Indications:              High risk colon cancer surveillance: Personal                            history of colonic polyps Medicines:                Monitored Anesthesia Care Procedure:                Pre-Anesthesia Assessment:                           - Prior to the procedure, a History and Physical                            was performed, and patient medications and                            allergies were reviewed. The patient's tolerance of                            previous anesthesia was also reviewed. The risks                            and benefits of the procedure and the sedation                            options and risks were discussed with the patient.                            All questions were answered, and informed consent                            was obtained. Prior Anticoagulants: The patient has                            taken no previous anticoagulant or antiplatelet                            agents. ASA Grade Assessment: II - A patient with                            mild systemic disease. After reviewing the risks                            and benefits, the patient was deemed in                            satisfactory condition to undergo the procedure.  After obtaining informed consent, the colonoscope                            was passed under direct vision. Throughout the                            procedure, the patient's blood pressure, pulse, and                            oxygen saturations were monitored continuously. The                            Colonoscope was introduced through the anus and                            advanced to the the cecum, identified by                          appendiceal orifice and ileocecal valve. The                            colonoscopy was performed without difficulty. The                            patient tolerated the procedure well. The quality                            of the bowel preparation was good. Scope In: 11:28:04 AM Scope Out: 11:38:26 AM Scope Withdrawal Time: 0 hours 8 minutes 49 seconds  Total Procedure Duration: 0 hours 10 minutes 22 seconds  Findings:                 A few small-mouthed diverticula were found in the                            sigmoid colon and few in ascending colon.                           Non-bleeding internal hemorrhoids were found during                            retroflexion. The hemorrhoids were small.                           The exam was otherwise without abnormality on                            direct and retroflexion views. Complications:            No immediate complications. Estimated Blood Loss:     Estimated blood loss: none. Impression:               - Mild pancolonic diverticulosis predominantly in                            the sigmoid colon.                           -  Otherwise normal colonoscopy. Recommendation:           - Patient has a contact number available for                            emergencies. The signs and symptoms of potential                            delayed complications were discussed with the                            patient. Return to normal activities tomorrow.                            Written discharge instructions were provided to the                            patient.                           - High fiber diet.                           - Continue present medications.                           - Repeat colonoscopy in 10 years for screening                            purposes.                           - Return to GI office PRN. Jackquline Denmark, MD 06/26/2018 11:43:40 AM This report has been signed electronically.

## 2018-06-26 NOTE — Patient Instructions (Signed)
Handouts given on hemorrhoids, diverticulosis and high fiber diet  YOU HAD AN ENDOSCOPIC PROCEDURE TODAY AT THE [facility]:   Refer to the procedure report that was given to you for any specific questions about what was found during the examination.  If the procedure report does not answer your questions, please call your gastroenterologist to clarify.  If you requested that your care partner not be given the details of your procedure findings, then the procedure report has been included in a sealed envelope for you to review at your convenience later.  YOU SHOULD EXPECT: Some feelings of bloating in the abdomen. Passage of more gas than usual.  Walking can help get rid of the air that was put into your GI tract during the procedure and reduce the bloating. If you had a lower endoscopy (such as a colonoscopy or flexible sigmoidoscopy) you may notice spotting of blood in your stool or on the toilet paper. If you underwent a bowel prep for your procedure, you may not have a normal bowel movement for a few days.  Please Note:  You might notice some irritation and congestion in your nose or some drainage.  This is from the oxygen used during your procedure.  There is no need for concern and it should clear up in a day or so.  SYMPTOMS TO REPORT IMMEDIATELY:   Following lower endoscopy (colonoscopy or flexible sigmoidoscopy):  Excessive amounts of blood in the stool  Significant tenderness or worsening of abdominal pains  Swelling of the abdomen that is new, acute  Fever of 100F or higher   For urgent or emergent issues, a gastroenterologist can be reached at any hour by calling [phone].   DIET:  We do recommend a small meal at first, but then you may proceed to your regular diet.  Drink plenty of fluids but you should avoid alcoholic beverages for 24 hours.  ACTIVITY:  You should plan to take it easy for the rest of today and you should NOT DRIVE or use heavy machinery until  tomorrow (because of the sedation medicines used during the test).    FOLLOW UP: Our staff will call the number listed on your records the next business day following your procedure to check on you and address any questions or concerns that you may have regarding the information given to you following your procedure. If we do not reach you, we will leave a message.  However, if you are feeling well and you are not experiencing any problems, there is no need to return our call.  We will assume that you have returned to your regular daily activities without incident.  If any biopsies were taken you will be contacted by phone or by letter within the next 1-3 weeks.  Please call us at [phone] if you have not heard about the biopsies in 3 weeks.    SIGNATURES/CONFIDENTIALITY: You and/or your care partner have signed paperwork which will be entered into your electronic medical record.  These signatures attest to the fact that that the information above on your After Visit Summary has been reviewed and is understood.  Full responsibility of the confidentiality of this discharge information lies with you and/or your care-partner. 

## 2018-06-27 ENCOUNTER — Telehealth: Payer: Self-pay

## 2018-06-27 NOTE — Telephone Encounter (Signed)
  Follow up Call-  Call back number 06/26/2018  Post procedure Call Back phone  # [phone]  Permission to leave phone message Yes  Some recent data might be hidden     Patient questions:  Do you have a fever, pain , or abdominal swelling? No. Pain Score  0 *  Have you tolerated food without any problems? Yes.    Have you been able to return to your normal activities? Yes.    Do you have any questions about your discharge instructions: Diet   No. Medications  No. Follow up visit  No.  Do you have questions or concerns about your Care? No.  Actions: * If pain score is 4 or above: No action needed, pain <4.  No problems noted per pt. maw

## 2018-10-31 ENCOUNTER — Telehealth: Payer: Self-pay | Admitting: Medical

## 2018-10-31 NOTE — Telephone Encounter (Signed)
Pt called and is requesting a referral for a foot dr states he is still having a burning feeling in both of his feet but more in his left foot, states you and him have discussed this before, pt can be reached at [phone]

## 2018-11-01 NOTE — Telephone Encounter (Signed)
Patient schedule appointment for November 05, 2018

## 2018-11-01 NOTE — Telephone Encounter (Signed)
Make med check f/u appt as he is due, and we can discuss the pains further.    So far he is on low dose Gabapentin, so we can discuss options

## 2018-11-01 NOTE — Telephone Encounter (Signed)
Left message on voicemail for patient to call back to schedule fasting medcheck.  

## 2018-11-01 NOTE — Telephone Encounter (Signed)
forwarding to you  °

## 2018-11-05 ENCOUNTER — Encounter: Payer: Self-pay | Admitting: Medical

## 2018-11-05 ENCOUNTER — Ambulatory Visit: Payer: 59 | Admitting: Medical

## 2018-11-05 VITALS — BP 120/70 | HR 72 | Temp 98.3°F | Resp 16 | Ht 72.0 in | Wt 244.6 lb

## 2018-11-05 DIAGNOSIS — G2581 Restless legs syndrome: Secondary | ICD-10-CM

## 2018-11-05 DIAGNOSIS — R0989 Other specified symptoms and signs involving the circulatory and respiratory systems: Secondary | ICD-10-CM | POA: Insufficient documentation

## 2018-11-05 DIAGNOSIS — E785 Hyperlipidemia, unspecified: Secondary | ICD-10-CM | POA: Diagnosis not present

## 2018-11-05 DIAGNOSIS — Z6832 Body mass index (BMI) 32.0-32.9, adult: Secondary | ICD-10-CM

## 2018-11-05 DIAGNOSIS — I10 Essential (primary) hypertension: Secondary | ICD-10-CM | POA: Diagnosis not present

## 2018-11-05 DIAGNOSIS — E669 Obesity, unspecified: Secondary | ICD-10-CM

## 2018-11-05 DIAGNOSIS — R7301 Impaired fasting glucose: Secondary | ICD-10-CM

## 2018-11-05 DIAGNOSIS — R208 Other disturbances of skin sensation: Secondary | ICD-10-CM

## 2018-11-05 MED ORDER — GABAPENTIN 300 MG PO CAPS: 300.0000 mg | ORAL_CAPSULE | Freq: Every day | ORAL | 0 refills | Status: DC

## 2018-11-05 NOTE — Patient Instructions (Addendum)
Neuropathy type pain in legs  Begin 2 capsules of Gabapentin/Neurontin daily at bedtime  After 1-2 weeks, if still not improving, increase to 3 capsules at bedtime daily  Let me know in a few weeks, which dose is working for you  This medication can be increased to twice daily if still not improving after 3-4 weeks  caution as this can make you drowsy  We will refer for blood flow studies in your legs called ABI ankle brachial index through the vascular center  I will inquire with orthopedics about nerve conduction studies  Your blood pressure looks great, continue your medications  Eat a healthy low fat diet.   Get at least 150 minutes of exercise weekly  We will recheck cholesterol today to see if improved on Crestor    Continue efforts to lose weight through healthy diet and exercise  Lets change strategies and try something that may work better than what you are currently doing   Breakfast You may eat 1 of the following  Smoothie with Almond milk, handful of kale or spinach, and 1-2 fruit servings of your choice such as berries or 1/2 banana  Whole grain slice of toast and thin layer of low sugar jam or small amount of honey  Whole grain slice of toast and avocado spread  1/2 cup of steel cut oats (oatmeal)   Mid-morning snack 1 fruit serving such as one of the following:  medium-sized apple  medium-sized orange,  Tangerine  1/2 banana   3/4 cup of fresh berries or frozen berries  A protein source such as one of the following:  8 almonds   small handful of walnuts or other nuts  Hummus and vegetable such as carrots   Lunch A protein source such as 1 of the following: . 1 serving of beans such as black beans, pinto beans, green beans, or edamame (soy beans) . Veggie burger  . Non breaded fish such as salmon or tuna, either baked, grilled, or broiled Vegetable - Half of your plate should be a non-starchy vegetables!  So avoid white potatoes and  corn.  Otherwise, eat a large portion of vegetables. . Avocado, cucumber, tomato, carrots, greens, lettuce, squash, okra, etc.  . Vegetables can include salad with olive oil/vinaigrette dressing Grains such as 1/2 cup of brown rice, quinoa, barley or other whole grain or 1 or 2 slices of whole grain bread   Mid-afternoon snack 1 fruit serving such as one of the following:  medium-sized apple  medium-sized orange,  Tangerine  1/2 banana   3/4 cup of fresh berries or frozen berries  A protein source such as one of the following:  8 almonds   small handful of walnuts or other nuts  Hummus and vegetable such as carrots   Dinner A protein source such as 1 of the following: . 1 serving of beans such as black beans, pinto beans, green beans, or edamame (soy beans) . Veggie burger  . Non breaded fish such as salmon or tuna, either baked, grilled, or broiled Vegetable - Half of your plate should be a non-starchy vegetables!  So avoid white potatoes and corn.  Otherwise, eat a large portion of vegetables. . Avocado, cucumber, tomato, carrots, greens, lettuce, squash, okra, etc.  . Vegetables can include salad with olive oil/vinaigrette dressing Grains such as 1/2 cup of brown rice, quinoa, barley or other whole grain or 1 or 2 slices of whole grain bread   Beverages: Water Unsweet tea Home made  juice with a juicer without sugar added other than small bit of honey or agave nectar Water with sugar free flavor such as Mio   AVOID.... For the time being I want you to cut out the following items completely: . Soda, sweet tea, juice, beer or wine or alcohol . Sweets such as cake, candy, pies, chips, cookies, chocolate

## 2018-11-05 NOTE — Progress Notes (Signed)
  Subjective:     Patient ID: Dave Carpenter, male   DOB: [birth year], 63 y.o.   MRN: [id number]  HPI Chief Complaint  Patient presents with  . fasting med check    fasting med check    Dave Carpenter is a 63 year old male here for med check.  He has a history significant for hypertension, hyperlipidemia, ED, chronic kidney disease, impaired fasting glucose, snoring, former smoker, restless leg syndrome, chronic knee pain, pains and burning in feet.  Hypertension-compliant with amlodipine 5 mg daily, carvedilol 6.25 mg twice daily, losartan HCT 50/12.5 mg daily  He has suspected neuropathy given work history on concrete hard floors, burning pains in his feet and history of restless leg syndrome.  Compliant with gabapentin 100 mg daily at bedtime.  GERD - Taking omeprazole 40 mg daily   Hyperlipidemia- taking rosuvastatin 40 mg daily  Review of Systems As in subjective    Objective:   Physical Exam BP 120/70   Pulse 72   Temp 98.3 F (36.8 C) (Oral)   Resp 16   Ht 6' (1.829 m)   Wt 244 lb 9.6 oz (110.9 kg)   SpO2 98%   BMI 33.17 kg/m    Wt Readings from Last 3 Encounters:  11/05/18 244 lb 9.6 oz (110.9 kg)  06/12/18 239 lb (108.4 kg)  06/07/18 238 lb (108 kg)   General appearance: alert, no distress, WD/WN,  Heart: RRR, normal S1, S2, no murmurs Lungs: CTA bilaterally, no wheezes, rhonchi, or rales Abdomen: +bs, soft, non tender, non distended, no masses, no hepatomegaly, no splenomegaly No inguinal bruits Pulses: 2+ symmetric, upper extremities, normal cap refill LE with 1+ pedal pulses, normal cap refill bilat, no hair below mid shaft of legs        Assessment:     Encounter Diagnoses  Name Primary?  . Essential hypertension Yes  . Dyslipidemia   . RLS (restless legs syndrome)   . Class 1 obesity with serious comorbidity and body mass index (BMI) of 32.0 to 32.9 in adult, unspecified obesity type   . Burning sensation of feet   . Impaired fasting blood sugar   .  Decreased pedal pulses        Plan:     Hypertension-controlled on current medications, continue current medication  Dyslipidemia-fasting lab today since he is been on Crestor 40 mg since last visit  Restless leg syndrome, burning sensation, likely neuropathy in feet- will get ABIs given the decreased pulses but will likely need nerve conduction study.  In the meantime increase to gabapentin 200 mg nightly and can increase to 300 if needed  Obesity, impaired sugar- continue efforts at healthy diet and weight loss  Dave Carpenter was seen today for fasting med check.  Diagnoses and all orders for this visit:  Essential hypertension  Dyslipidemia -     Lipid panel  RLS (restless legs syndrome)  Class 1 obesity with serious comorbidity and body mass index (BMI) of 32.0 to 32.9 in adult, unspecified obesity type  Burning sensation of feet -     VAS [place] ABI WITH/WO TBI; Future  Impaired fasting blood sugar  Decreased pedal pulses -     VAS [place] ABI WITH/WO TBI; Future  Other orders -     gabapentin (NEURONTIN) 300 MG capsule; Take 1 capsule (300 mg total) by mouth at bedtime.

## 2018-11-06 ENCOUNTER — Other Ambulatory Visit: Payer: Self-pay | Admitting: Medical

## 2018-11-06 DIAGNOSIS — R0989 Other specified symptoms and signs involving the circulatory and respiratory systems: Secondary | ICD-10-CM

## 2018-11-06 DIAGNOSIS — R208 Other disturbances of skin sensation: Secondary | ICD-10-CM

## 2018-11-06 LAB — LIPID PANEL
Chol/HDL Ratio: 4.4 ratio (ref 0.0–5.0)
Cholesterol, Total: 179 mg/dL (ref 100–199)
HDL: 41 mg/dL (ref >39)
LDL Calculated: 105 mg/dL — ABNORMAL HIGH (ref 0–99)
Triglycerides: 163 mg/dL — ABNORMAL HIGH (ref 0–149)
VLDL Cholesterol Cal: 33 mg/dL (ref 5–40)

## 2018-11-07 ENCOUNTER — Ambulatory Visit (HOSPITAL_COMMUNITY)
Admission: RE | Admit: 2018-11-07 | Discharge: 2018-11-07 | Disposition: A | Discharge status: Home / Self Care | Payer: 59 | Source: Ambulatory Visit | Attending: Internal Medicine | Admitting: Internal Medicine

## 2018-11-07 DIAGNOSIS — R208 Other disturbances of skin sensation: Secondary | ICD-10-CM

## 2018-11-07 DIAGNOSIS — R0989 Other specified symptoms and signs involving the circulatory and respiratory systems: Secondary | ICD-10-CM

## 2018-11-08 ENCOUNTER — Other Ambulatory Visit: Payer: Self-pay

## 2018-11-08 DIAGNOSIS — G8929 Other chronic pain: Secondary | ICD-10-CM

## 2018-11-12 ENCOUNTER — Other Ambulatory Visit: Payer: Self-pay

## 2018-11-12 DIAGNOSIS — R0989 Other specified symptoms and signs involving the circulatory and respiratory systems: Secondary | ICD-10-CM

## 2018-11-12 DIAGNOSIS — R208 Other disturbances of skin sensation: Secondary | ICD-10-CM

## 2018-11-12 DIAGNOSIS — G2581 Restless legs syndrome: Secondary | ICD-10-CM

## 2018-11-13 ENCOUNTER — Other Ambulatory Visit: Payer: Self-pay

## 2018-11-13 DIAGNOSIS — R0989 Other specified symptoms and signs involving the circulatory and respiratory systems: Secondary | ICD-10-CM

## 2018-11-13 DIAGNOSIS — R208 Other disturbances of skin sensation: Secondary | ICD-10-CM

## 2018-11-15 ENCOUNTER — Telehealth: Payer: Self-pay

## 2018-11-15 NOTE — Telephone Encounter (Signed)
Patient has appointment on 11-18-2018 at 240 with Park Liter PA with [facility].  Patient notified of appointment.

## 2018-11-18 ENCOUNTER — Ambulatory Visit
Admission: RE | Admit: 2018-11-18 | Discharge: 2018-11-18 | Disposition: A | Discharge status: Home / Self Care | Payer: 59 | Source: Ambulatory Visit | Attending: Physician Assistant | Admitting: Physician Assistant

## 2018-11-18 ENCOUNTER — Other Ambulatory Visit: Payer: Self-pay

## 2018-11-18 ENCOUNTER — Other Ambulatory Visit: Payer: Self-pay | Admitting: Physician Assistant

## 2018-11-18 DIAGNOSIS — R52 Pain, unspecified: Secondary | ICD-10-CM

## 2018-11-21 ENCOUNTER — Other Ambulatory Visit: Payer: Self-pay | Admitting: Pain Medicine

## 2018-11-21 ENCOUNTER — Other Ambulatory Visit: Payer: Self-pay | Admitting: Medical

## 2018-11-21 DIAGNOSIS — M546 Pain in thoracic spine: Secondary | ICD-10-CM

## 2018-11-21 DIAGNOSIS — M545 Low back pain, unspecified: Secondary | ICD-10-CM

## 2018-11-23 ENCOUNTER — Other Ambulatory Visit: Payer: Self-pay | Admitting: Medical

## 2018-11-25 NOTE — Telephone Encounter (Signed)
Is this ok to refill?  

## 2018-12-02 ENCOUNTER — Other Ambulatory Visit: Payer: Self-pay | Admitting: Medical

## 2018-12-02 MED ORDER — GABAPENTIN 300 MG PO CAPS: 300.0000 mg | ORAL_CAPSULE | Freq: Every day | ORAL | 0 refills | Status: DC

## 2018-12-03 ENCOUNTER — Telehealth: Payer: Self-pay | Admitting: Neurology

## 2018-12-03 NOTE — Telephone Encounter (Signed)
Called the patient to inform them that our office has placed new protocols in place for our office visits. Due to the virus pandemic our office is reducing our number of office visits in order to minimize the risk to our patients and healthcare providers. Advised that our office is now providing the capability to offer the patients phone visits at this time. Informed of what that process looks like and informed that the telephone office visit will still be billed through insurance and due to Valentine we need them to know since the appointment is taking place over the phone, we can't guarantee the security of the phone line. With that said if we do move forward I would have to get verbal consent to completed the call over the phone.  Patient declined the video/telephone visit and would prefer to defer the appointment out to hopefully come in, in person to apt. I have pushed the apt out to May 13 at 2:00 pm. Advised the patient I will add to wait list if something was to open up sooner. Pt verbalized understanding.

## 2018-12-04 ENCOUNTER — Ambulatory Visit: Payer: 59 | Admitting: Neurology

## 2018-12-07 ENCOUNTER — Other Ambulatory Visit: Payer: 59

## 2019-01-15 ENCOUNTER — Ambulatory Visit: Payer: Self-pay | Admitting: Neurology

## 2019-01-22 ENCOUNTER — Other Ambulatory Visit: Payer: Self-pay

## 2019-01-22 ENCOUNTER — Ambulatory Visit (INDEPENDENT_AMBULATORY_CARE_PROVIDER_SITE_OTHER): Payer: 59 | Admitting: Neurology

## 2019-01-22 ENCOUNTER — Encounter: Payer: Self-pay | Admitting: Neurology

## 2019-01-22 VITALS — BP 134/85 | HR 88 | Temp 95.5°F | Ht 71.0 in | Wt 247.0 lb

## 2019-01-22 DIAGNOSIS — G629 Polyneuropathy, unspecified: Secondary | ICD-10-CM | POA: Diagnosis not present

## 2019-01-22 DIAGNOSIS — Z87891 Personal history of nicotine dependence: Secondary | ICD-10-CM | POA: Diagnosis not present

## 2019-01-22 DIAGNOSIS — M79671 Pain in right foot: Secondary | ICD-10-CM

## 2019-01-22 DIAGNOSIS — R7303 Prediabetes: Secondary | ICD-10-CM | POA: Diagnosis not present

## 2019-01-22 DIAGNOSIS — M79672 Pain in left foot: Secondary | ICD-10-CM

## 2019-01-22 NOTE — Patient Instructions (Signed)
Neuropathic Pain Neuropathic pain is pain caused by damage to the nerves that are responsible for certain sensations in your body (sensory nerves). The pain can be caused by:  Damage to the sensory nerves that send signals to your spinal cord and brain (peripheral nervous system).  Damage to the sensory nerves in your brain or spinal cord (central nervous system). Neuropathic pain can make you more sensitive to pain. Even a minor sensation can feel very painful. This is usually a long-term condition that can be difficult to treat. The type of pain differs from person to person. It may:  Start suddenly (acute), or it may develop slowly and last for a long time (chronic).  Come and go as damaged nerves heal, or it may stay at the same level for years.  Cause emotional distress, loss of sleep, and a lower quality of life. What are the causes? The most common cause of this condition is diabetes. Many other diseases and conditions can also cause neuropathic pain. Causes of neuropathic pain can be classified as:  Toxic. This is caused by medicines and chemicals. The most common cause of toxic neuropathic pain is damage from cancer treatments (chemotherapy).  Metabolic. This can be caused by: ? Diabetes. This is the most common disease that damages the nerves. ? Lack of vitamin B from long-term alcohol abuse.  Traumatic. Any injury that cuts, crushes, or stretches a nerve can cause damage and pain. A common example is feeling pain after losing an arm or leg (phantom limb pain).  Compression-related. If a sensory nerve gets trapped or compressed for a long period of time, the blood supply to the nerve can be cut off.  Vascular. Many blood vessel diseases can cause neuropathic pain by decreasing blood supply and oxygen to nerves.  Autoimmune. This type of pain results from diseases in which the body's defense system (immune system) mistakenly attacks sensory nerves. Examples of autoimmune diseases  that can cause neuropathic pain include lupus and multiple sclerosis.  Infectious. Many types of viral infections can damage sensory nerves and cause pain. Shingles infection is a common cause of this type of pain.  Inherited. Neuropathic pain can be a symptom of many diseases that are passed down through families (genetic). What increases the risk? You are more likely to develop this condition if:  You have diabetes.  You smoke.  You drink too much alcohol.  You are taking certain medicines, including medicines that kill cancer cells (chemotherapy) or that treat immune system disorders. What are the signs or symptoms? The main symptom is pain. Neuropathic pain is often described as:  Burning.  Shock-like.  Stinging.  Hot or cold.  Itching. How is this diagnosed? No single test can diagnose neuropathic pain. It is diagnosed based on:  Physical exam and your symptoms. Your health care provider will ask you about your pain. You may be asked to use a pain scale to describe how bad your pain is.  Tests. These may be done to see if you have a high sensitivity to pain and to help find the cause and location of any sensory nerve damage. They include: ? Nerve conduction studies to test how well nerve signals travel through your sensory nerves (electrodiagnostic testing). ? Stimulating your sensory nerves through electrodes on your skin and measuring the response in your spinal cord and brain (somatosensory evoked potential).  Imaging studies, such as: ? X-rays. ? CT scan. ? MRI. How is this treated? Treatment for neuropathic pain may change   over time. You may need to try different treatment options or a combination of treatments. Some options include:  Treating the underlying cause of the neuropathy, such as diabetes, kidney disease, or vitamin deficiencies.  Stopping medicines that can cause neuropathy, such as chemotherapy.  Medicine to relieve pain. Medicines may include: ?  Prescription or over-the-counter pain medicine. ? Anti-seizure medicine. ? Antidepressant medicines. ? Pain-relieving patches that are applied to painful areas of skin. ? A medicine to numb the area (local anesthetic), which can be injected as a nerve block.  Transcutaneous nerve stimulation. This uses electrical currents to block painful nerve signals. The treatment is painless.  Alternative treatments, such as: ? Acupuncture. ? Meditation. ? Massage. ? Physical therapy. ? Pain management programs. ? Counseling. Follow these instructions at home: Medicines   Take over-the-counter and prescription medicines only as told by your health care provider.  Do not drive or use heavy machinery while taking prescription pain medicine.  If you are taking prescription pain medicine, take actions to prevent or treat constipation. Your health care provider may recommend that you: ? Drink enough fluid to keep your urine pale yellow. ? Eat foods that are high in fiber, such as fresh fruits and vegetables, whole grains, and beans. ? Limit foods that are high in fat and processed sugars, such as fried or sweet foods. ? Take an over-the-counter or prescription medicine for constipation. Lifestyle   Have a good support system at home.  Consider joining a chronic pain support group.  Do not use any products that contain nicotine or tobacco, such as cigarettes and e-cigarettes. If you need help quitting, ask your health care provider.  Do not drink alcohol. General instructions  Learn as much as you can about your condition.  Work closely with all your health care providers to find the treatment plan that works best for you.  Ask your health care provider what activities are safe for you.  Keep all follow-up visits as told by your health care provider. This is important. Contact a health care provider if:  Your pain treatments are not working.  You are having side effects from your  medicines.  You are struggling with tiredness (fatigue), mood changes, depression, or anxiety. Summary  Neuropathic pain is pain caused by damage to the nerves that are responsible for certain sensations in your body (sensory nerves).  Neuropathic pain may come and go as damaged nerves heal, or it may stay at the same level for years.  Neuropathic pain is usually a long-term condition that can be difficult to treat. Consider joining a chronic pain support group. This information is not intended to replace advice given to you by your health care provider. Make sure you discuss any questions you have with your health care provider. Document Released: 05/18/2004 Document Revised: 09/07/2017 Document Reviewed: 09/07/2017 Elsevier Interactive Patient Education  2019 [employer] Inc[employer]  

## 2019-01-22 NOTE — Progress Notes (Signed)
Provider:  Larey Seat, M D  Referring Provider: Carlena Hurl, PA-C Primary Care Physician:  Dave Hurl, PA-C  Chief Complaint  Patient presents with  . New Patient (Initial Visit)    pt alone, rm 10. pt has been having decreased sensation in lower extremities for 7-8 mths. pt has not had EMG/NCV.     HPI:  Dave Carpenter is a 63 y.o. male and  seen here in person on 01-22-2019 upon referral PA Tysinger for repeat Sleep consult?   Patient insists it's all about foot PAIN , and that he can not afford a sleep work up. He has seen me in the past for a sleep consult and refused to be worked up.   Patient  had X rays lower spine, some bone spurs , no disc defect ( 11-18-2018) . Has not had MRI , has not had NCV/ EMG and was in the work up when corona pandemic hit.  He reports foot pain, burning, sometimes numbness. No trauma, no surgeries. No oedema or redness, no temperature changes.   HPI:  2016 last consult -   Dave Carpenter is a 63 y.o. male  Is seen here as a referralfrom Dave Carpenter ,DDS  for a sleep apnea evaluation,  [name] is an african american right handed male patient, working as an Database administrator. He works form 8 - 5 daily, has no history of shift work.  The patient's was seen at [facility] for a regular dental appointment where he filled a questionnaire that was so suspicious for him having obstructive sleep apnea. Primary care physician is Dr. Izora Carpenter. In short the sleep screening questionnaire endorsed that the patient is 6 foot tall has a weight of 225 pounds and endorsed the Epworth sleepiness score at 5 points. Attention he snores frequently he feels tired in daytime also he doesn't necessarily feel that he is ready to fall asleep he has bruxism and neck size over 17 inches and has had witnessed apneas.  That habits are as follows usually goes to sleep on his cough around 10:00 PM and transversely the bedroom between 2 and 3  AM. His wife works the late shift and comes home at the time when he transfers. His wife witnessed him to snore loudly and has seen him with irregular breathing. He wakes up about 6:30 every morning spontaneously and without the need for an alarm to be set. As no nocturia breaks. Otherwise his sleep is unfragmented. He states that he feels actually lousy in the morning and neither refreshed nor restored his aching he feels sore and worn. He is reportedly a restless sleeper and tosses and turns at night and he has bruxism which is a form of periodic limb movements. Also endorsed some restless leg syndrome symptoms. He sometimes has the irresistible urge to move to stop the feeling of a creepy crawly sensation.  He does not have a daytime nap nor urge. He used to nap over his lunch break in his car. He stopped that. His naps were more refreshing then the nocturnal sleep. He sometimes sleeps on Sunday after Lunch.   The patient has no history of trauma to the airway, neck, or facial skull no obstruction, he is unaware of any sleep disorders affecting him in childhood such as sleepwalking or sleep talking. Family history is also negative for sleep disorders or sleep disordered breathing.  He is a non smoker, quit 2006, he quit  drinking, 2 sodas a day , coke.    Review of Systems: Out of a complete 14 system review, the patient complains of only the following symptoms, and all other reviewed systems are negative. Snoring, sleep apnea, feeling exhausted. RLS - creepy crawly .  See above.    Social History   Socioeconomic History  . Marital status: Single    Spouse name: Not on file  . Number of children: Not on file  . Years of education: Not on file  . Highest education level: Not on file  Occupational History  . Occupation: [employer]  Social Needs  . Financial resource strain: Not on file  . Food insecurity:    Worry: Not on file    Inability: Not on file  . Transportation needs:    Medical:  Not on file    Non-medical: Not on file  Tobacco Use  . Smoking status: Former Smoker    Packs/day: 1.00    Years: 30.00    Pack years: 30.00    Last attempt to quit: 01/06/2003    Years since quitting: 16.0  . Smokeless tobacco: Never Used  Substance and Sexual Activity  . Alcohol use: No    Alcohol/week: 0.0 standard drinks  . Drug use: No  . Sexual activity: Not on file  Lifestyle  . Physical activity:    Days per week: Not on file    Minutes per session: Not on file  . Stress: Not on file  Relationships  . Social connections:    Talks on phone: Not on file    Gets together: Not on file    Attends religious service: Not on file    Active member of club or organization: Not on file    Attends meetings of clubs or organizations: Not on file    Relationship status: Not on file  . Intimate partner violence:    Fear of current or ex partner: Not on file    Emotionally abused: Not on file    Physically abused: Not on file    Forced sexual activity: Not on file  Other Topics Concern  . 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   No particular diet discretion.  05/2018    Family History  Problem Relation Age of Onset  . Diabetes Mother   . Stroke Mother   . Other Mother        brain tumor  . Stroke Father   . Diabetes Father   . Heart disease Sister   . Heart disease Brother   . Cancer Neg Hx     Past Medical History:  Diagnosis Date  . Allergy   . Arthritis    knees, shouders, wrists  . Birth mark    upper abdomen  . CKD (chronic kidney disease)    had blood in urine several times  . Elevated uric acid in blood 2018  . Erectile dysfunction   . GERD (gastroesophageal reflux disease)   . H/O cardiovascular stress test 06/2009   normal nuclear stress test, Dr. Quay Burow  . H/O echocardiogram 05/2016   normal LV systolic function, EF 25-85%, grade 1 diastolic dysfunction  . History of  seizure    few times as a child, none as an adult  . Hyperlipidemia   . Hypertension   . Impaired fasting blood sugar 2018  . Lipoma of back    since 2007 or earlier  .  Macular degeneration   . Snoring 2018   sleep study declined due to cost  . Wears glasses     Past Surgical History:  Procedure Laterality Date  . COLONOSCOPY  2008   Dr. Benson [place]  . ganglion cyst removal     left wrist  . right lung surgery to remove mass  2007   benign  . TONSILLECTOMY      Current Outpatient Medications  Medication Sig Dispense Refill  . amLODipine (NORVASC) 5 MG tablet TAKE 1 TABLET BY MOUTH EVERY DAY 90 tablet 1  . carvedilol (COREG) 6.25 MG tablet Take 1 tablet (6.25 mg total) by mouth 2 (two) times daily with a meal. Reported on 01/06/2016 180 tablet 3  . diclofenac sodium (VOLTAREN) 1 % GEL APPLY 4 G UP TO THREE TIMES A DAY AS NEEDED    . gabapentin (NEURONTIN) 300 MG capsule Take 1 capsule (300 mg total) by mouth at bedtime. 90 capsule 0  . losartan-hydrochlorothiazide (HYZAAR) 50-12.5 MG tablet Take 1 tablet by mouth daily. 90 tablet 3  . omeprazole (PRILOSEC) 40 MG capsule Take 1 capsule (40 mg total) by mouth daily. 90 capsule 3  . rosuvastatin (CRESTOR) 40 MG tablet Take 1 tablet (40 mg total) by mouth daily. 90 tablet 3   No current facility-administered medications for this visit.     Allergies as of 01/22/2019 - Review Complete 01/22/2019  Allergen Reaction Noted  . Aspirin  03/23/2015    Vitals: BP 134/85   Pulse 88   Temp (!) 95.5 F (35.3 C)   Ht 5\' 11"  (1.803 m)   Wt 247 lb (112 kg)   BMI 34.45 kg/m  Last Weight:  Wt Readings from Last 1 Encounters:  01/22/19 247 lb (112 kg)   Last Height:   Ht Readings from Last 1 Encounters:  01/22/19 5\' 11"  (1.803 m)    Physical exam:  General: The patient is awake, alert and appears not in acute distress. The patient is well groomed. Head: Normocephalic, atraumatic. Neck is supple. Cardiovascular:  Regular rate and  rhythm , without  murmurs or carotid bruit, and without distended neck veins. Respiratory: Lungs are clear to auscultation. Skin:  Without evidence of edema, or rash Trunk: BMI is 34 elevated and patient  has normal posture.  Neurologic exam : The patient is awake and alert, oriented to place and time.  Memory subjective  described as intact. There is a normal attention span & concentration ability. Speech is fluent without  dysarthria, dysphonia or aphasia. Mood and affect are appropriate.  Cranial nerves: Pupils are equal and briskly reactive to light. Extraocular movements  in vertical and horizontal planes intact and without nystagmus.  Visual fields by finger perimetry are intact. Hearing to finger rub intact.  Facial sensation intact to fine touch. Facial motor strength is symmetric and tongue and uvula move midline. Tongue protrusion into either cheek is normal. He reports stiffness in his neck, Shoulder shrug is normal.   Motor exam: Normal tone ,muscle bulk and symmetric  strength in all extremities.  Sensory:  Fine touch to the feet causes a tickling the patient can hardly tolerate.  Fine touch, pinprick and vibration were tested in all extremities. Proprioception was normal.  Coordination: Rapid alternating movements in the fingers/hands were normal. Finger-to-nose maneuver  normal without evidence of ataxia, dysmetria or tremor.  Gait and station: Patient walks without assistive device and is able unassisted to climb up to the exam table. Strength within normal limits. Stance  is stable and normal. Tandem gait is unfragmented. Deep tendon reflexes: in the  upper and lower extremities are symmetric and intact. Babinski maneuver response is downgoing.   Assessment:  After physical and neurologic examination, review of laboratory studies, imaging, neurophysiology testing and pre-existing records, assessment is that of :  Possible neuropathy, less likely DDD/ spine related pathology.    Sleep was not addressed.    Plan:  Treatment plan and additional workup :  I  asked the patient to have his NCV. EMG here-  Since there is not much more work up for bilateral foot pain, and he told me he already had the test! Had it at orthopedist office and not available on EPIC.    Waiting for MRI scheduled with pain clinic in April was rescheduled,  where he has been referred also.   His referral  To [facility] was dated 05-08-2018 . Meanwhile has seen ortho and pain clinic.  lumbar X rays were non revealing.    Asencion Partridge Chieko Neises MD 01/22/2019

## 2019-01-23 LAB — CBC WITH DIFFERENTIAL/PLATELET
Basophils Absolute: 0 10*3/uL (ref 0.0–0.2)
Basos: 1 %
EOS (ABSOLUTE): 0.2 10*3/uL (ref 0.0–0.4)
Eos: 2 %
Hematocrit: 42.7 % (ref 37.5–51.0)
Hemoglobin: 14.7 g/dL (ref 13.0–17.7)
Immature Grans (Abs): 0 10*3/uL (ref 0.0–0.1)
Immature Granulocytes: 0 %
Lymphocytes Absolute: 2.1 10*3/uL (ref 0.7–3.1)
Lymphs: 31 %
MCH: 31.5 pg (ref 26.6–33.0)
MCHC: 34.4 g/dL (ref 31.5–35.7)
MCV: 92 fL (ref 79–97)
Monocytes Absolute: 0.7 10*3/uL (ref 0.1–0.9)
Monocytes: 11 %
Neutrophils Absolute: 3.7 10*3/uL (ref 1.4–7.0)
Neutrophils: 55 %
Platelets: 286 10*3/uL (ref 150–450)
RBC: 4.66 x10E6/uL (ref 4.14–5.80)
RDW: 13.1 % (ref 11.6–15.4)
WBC: 6.8 10*3/uL (ref 3.4–10.8)

## 2019-01-23 LAB — COMPREHENSIVE METABOLIC PANEL
ALT: 23 IU/L (ref 0–44)
AST: 20 IU/L (ref 0–40)
Albumin/Globulin Ratio: 1.6 (ref 1.2–2.2)
Albumin: 4.5 g/dL (ref 3.8–4.8)
Alkaline Phosphatase: 73 IU/L (ref 39–117)
BUN/Creatinine Ratio: 13 (ref 10–24)
BUN: 17 mg/dL (ref 8–27)
Bilirubin Total: 0.4 mg/dL (ref 0.0–1.2)
CO2: 26 mmol/L (ref 20–29)
Calcium: 9.8 mg/dL (ref 8.6–10.2)
Chloride: 101 mmol/L (ref 96–106)
Creatinine, Ser: 1.29 mg/dL — ABNORMAL HIGH (ref 0.76–1.27)
GFR calc Af Amer: 68 mL/min/{1.73_m2} (ref >59)
GFR calc non Af Amer: 59 mL/min/{1.73_m2} — ABNORMAL LOW (ref >59)
Globulin, Total: 2.9 g/dL (ref 1.5–4.5)
Glucose: 101 mg/dL — ABNORMAL HIGH (ref 65–99)
Potassium: 4.2 mmol/L (ref 3.5–5.2)
Sodium: 142 mmol/L (ref 134–144)
Total Protein: 7.4 g/dL (ref 6.0–8.5)

## 2019-01-23 LAB — HEMOGLOBIN A1C
Est. average glucose Bld gHb Est-mCnc: 128 mg/dL
Hgb A1c MFr Bld: 6.1 % — ABNORMAL HIGH (ref 4.8–5.6)

## 2019-01-23 LAB — IRON AND TIBC
Iron Saturation: 28 % (ref 15–55)
Iron: 84 ug/dL (ref 38–169)
Total Iron Binding Capacity: 303 ug/dL (ref 250–450)
UIBC: 219 ug/dL (ref 111–343)

## 2019-01-30 ENCOUNTER — Telehealth: Payer: Self-pay | Admitting: Neurology

## 2019-01-30 NOTE — Telephone Encounter (Signed)
Called the pt to review the lab work. There was no answer. LVM for the pt to call back.  If patient calls back please advise that labs were normal with exception of slight elevation in his creatinine levels which look at kidney function. Its better then it was 8 months ago and only slightly elevated. Encourage hydration and having Primary care watch creatinine as well as his hemoglobin A1C was elevated at 6.1 which concerning of prediabetes. Patient can also follow up on this with PCP.  She had no concerns other then those two

## 2019-01-30 NOTE — Telephone Encounter (Signed)
-----   Message from Larey Seat, MD sent at 01/26/2019  2:48 PM EDT ----- Elevated creatinine = GFR reduced, question id thi is CKD or acute kidney injury?  Normal CBC. Normal iron studies.  HbA1c above 6 is considered pre-diabetic. That can be a cause of neuropathy

## 2019-02-01 ENCOUNTER — Ambulatory Visit
Admission: RE | Admit: 2019-02-01 | Discharge: 2019-02-01 | Disposition: A | Discharge status: Home / Self Care | Payer: 59 | Source: Ambulatory Visit | Attending: Pain Medicine | Admitting: Pain Medicine

## 2019-02-01 ENCOUNTER — Other Ambulatory Visit: Payer: Self-pay

## 2019-02-01 DIAGNOSIS — M545 Low back pain, unspecified: Secondary | ICD-10-CM

## 2019-02-01 DIAGNOSIS — M546 Pain in thoracic spine: Secondary | ICD-10-CM

## 2019-02-24 ENCOUNTER — Other Ambulatory Visit: Payer: Self-pay | Admitting: Medical

## 2019-02-24 NOTE — Telephone Encounter (Signed)
Is this ok to refill?  

## 2019-05-12 ENCOUNTER — Other Ambulatory Visit: Payer: Self-pay | Admitting: Medical

## 2019-05-13 ENCOUNTER — Other Ambulatory Visit: Payer: Self-pay

## 2019-05-13 ENCOUNTER — Encounter: Payer: Self-pay | Admitting: Medical

## 2019-05-13 ENCOUNTER — Other Ambulatory Visit: Payer: Self-pay | Admitting: Medical

## 2019-05-13 ENCOUNTER — Ambulatory Visit (INDEPENDENT_AMBULATORY_CARE_PROVIDER_SITE_OTHER): Payer: 59 | Admitting: Medical

## 2019-05-13 VITALS — BP 128/82 | HR 96 | Temp 98.3°F | Ht 71.0 in | Wt 238.6 lb

## 2019-05-13 DIAGNOSIS — Z6832 Body mass index (BMI) 32.0-32.9, adult: Secondary | ICD-10-CM

## 2019-05-13 DIAGNOSIS — Z7189 Other specified counseling: Secondary | ICD-10-CM

## 2019-05-13 DIAGNOSIS — I10 Essential (primary) hypertension: Secondary | ICD-10-CM

## 2019-05-13 DIAGNOSIS — Q825 Congenital non-neoplastic nevus: Secondary | ICD-10-CM

## 2019-05-13 DIAGNOSIS — Z7185 Encounter for immunization safety counseling: Secondary | ICD-10-CM

## 2019-05-13 DIAGNOSIS — G4763 Sleep related bruxism: Secondary | ICD-10-CM | POA: Diagnosis not present

## 2019-05-13 DIAGNOSIS — Z0000 Encounter for general adult medical examination without abnormal findings: Secondary | ICD-10-CM

## 2019-05-13 DIAGNOSIS — H3530 Unspecified macular degeneration: Secondary | ICD-10-CM

## 2019-05-13 DIAGNOSIS — Z87891 Personal history of nicotine dependence: Secondary | ICD-10-CM

## 2019-05-13 DIAGNOSIS — R208 Other disturbances of skin sensation: Secondary | ICD-10-CM

## 2019-05-13 DIAGNOSIS — D171 Benign lipomatous neoplasm of skin and subcutaneous tissue of trunk: Secondary | ICD-10-CM

## 2019-05-13 DIAGNOSIS — Z23 Encounter for immunization: Secondary | ICD-10-CM | POA: Diagnosis not present

## 2019-05-13 DIAGNOSIS — G629 Polyneuropathy, unspecified: Secondary | ICD-10-CM

## 2019-05-13 DIAGNOSIS — K219 Gastro-esophageal reflux disease without esophagitis: Secondary | ICD-10-CM

## 2019-05-13 DIAGNOSIS — N400 Enlarged prostate without lower urinary tract symptoms: Secondary | ICD-10-CM

## 2019-05-13 DIAGNOSIS — E790 Hyperuricemia without signs of inflammatory arthritis and tophaceous disease: Secondary | ICD-10-CM

## 2019-05-13 DIAGNOSIS — N529 Male erectile dysfunction, unspecified: Secondary | ICD-10-CM

## 2019-05-13 DIAGNOSIS — R7301 Impaired fasting glucose: Secondary | ICD-10-CM

## 2019-05-13 DIAGNOSIS — E785 Hyperlipidemia, unspecified: Secondary | ICD-10-CM

## 2019-05-13 DIAGNOSIS — I700 Atherosclerosis of aorta: Secondary | ICD-10-CM

## 2019-05-13 DIAGNOSIS — Z125 Encounter for screening for malignant neoplasm of prostate: Secondary | ICD-10-CM

## 2019-05-13 DIAGNOSIS — R0683 Snoring: Secondary | ICD-10-CM

## 2019-05-13 DIAGNOSIS — E669 Obesity, unspecified: Secondary | ICD-10-CM

## 2019-05-13 NOTE — Progress Notes (Signed)
Subjective:   HPI  Dave Carpenter is a 63 y.o. male who presents for Chief Complaint  Patient presents with  . Annual Exam    with fasting labs    Medical team: Dr. Daneen Schick, cardiology Dr. Jackquline Denmark, gastroenterology Dr. Larey Seat, neurology Vision works for eye care  Dr. Baird Cancer, eye specialist Dentist Dr. Joni Fears, orthopedics Henna Derderian, Camelia Eng, PA-C here for primary care   Concerns: Hypertension-compliant with amlodipine 5 mg daily, carvedilol 6.25 mg twice daily, losartan HCT 50/12.5 mg daily.  Hyperlipidemia-compliant with Crestor 40 mg daily.  This was increased earlier in the year from a lower dose.  GERD- taking Prilosec 40 mg daily.  Paresthesias-taking gabapentin 300 mg daily  Microscopic hematuria?  History of snoring, declined sleep study in 2019 due to cost  Erectile dysfunction- no current concerns  Macular degeneration-sees eye doctor regularly  Not exercising - "too lazy to do it.  "  Father passed last week from compilcations of heart disease and diabetes.   63yo.   Reviewed their medical, surgical, family, social, medication, and allergy history and updated chart as appropriate.  Past Medical History:  Diagnosis Date  . Allergy   . Aortic atherosclerosis ([place]) 2017  . Arthritis    knees, shouders, wrists  . Birth mark    upper abdomen  . Elevated uric acid in blood 2018  . Erectile dysfunction   . GERD (gastroesophageal reflux disease)   . H/O cardiovascular stress test 06/2009   normal nuclear stress test, Dr. Quay Burow  . H/O echocardiogram 05/2016   normal LV systolic function, EF 0000000, grade 1 diastolic dysfunction  . History of seizure    few times as a child, none as an adult  . Hyperlipidemia   . Hypertension   . Impaired fasting blood sugar 2018  . Lipoma of back    since 2007 or earlier  . Macular degeneration   . Snoring 2018   sleep study declined due to cost  . Wears glasses     Past  Surgical History:  Procedure Laterality Date  . COLONOSCOPY  06/2018   Mild pancolonic diverticulosis of the sigmoid colon, Dr. Jackquline Denmark;  2008 with Dr. Benson Norway  . ganglion cyst removal     left wrist  . right lung surgery to remove mass  2007   benign  . TONSILLECTOMY      Social History   Socioeconomic History  . Marital status: Single    Spouse name: Not on file  . Number of children: Not on file  . Years of education: Not on file  . Highest education level: Not on file  Occupational History  . Occupation: [employer]  Social Needs  . Financial resource strain: Not on file  . Food insecurity    Worry: Not on file    Inability: Not on file  . Transportation needs    Medical: Not on file    Non-medical: Not on file  Tobacco Use  . Smoking status: Former Smoker    Packs/day: 1.00    Years: 30.00    Pack years: 30.00    Quit date: 01/06/2003    Years since quitting: 16.3  . Smokeless tobacco: Never Used  Substance and Sexual Activity  . Alcohol use: No    Alcohol/week: 0.0 standard drinks  . Drug use: No  . Sexual activity: Not on file  Lifestyle  . Physical activity    Days per week: Not on file  Minutes per session: Not on file  . Stress: Not on file  Relationships  . Social Herbalist on phone: Not on file    Gets together: Not on file    Attends religious service: Not on file    Active member of club or organization: Not on file    Attends meetings of clubs or organizations: Not on file    Relationship status: Not on file  . Intimate partner violence    Fear of current or ex partner: Not on file    Emotionally abused: Not on file    Physically abused: Not on file    Forced sexual activity: Not on file  Other Topics Concern  . Not on file  Social History Narrative   Married,  1 daughter, auto Dealer, [employer], bowling for exercise.   No particular diet discretion.  05/2019    Family History  Problem Relation Age of Onset  . Diabetes Mother    . Stroke Mother   . Other Mother        brain tumor  . Stroke Father   . Diabetes Father   . Heart disease Sister   . Heart disease Brother   . Cancer Neg Hx      Current Outpatient Medications:  .  amLODipine (NORVASC) 5 MG tablet, TAKE 1 TABLET BY MOUTH EVERY DAY, Disp: 90 tablet, Rfl: 1 .  diclofenac sodium (VOLTAREN) 1 % GEL, APPLY 4 G UP TO THREE TIMES A DAY AS NEEDED, Disp: , Rfl:  .  gabapentin (NEURONTIN) 300 MG capsule, TAKE 1 CAPSULE BY MOUTH EVERYDAY AT BEDTIME, Disp: 90 capsule, Rfl: 0 .  rosuvastatin (CRESTOR) 40 MG tablet, Take 1 tablet (40 mg total) by mouth daily., Disp: 90 tablet, Rfl: 3 .  carvedilol (COREG) 6.25 MG tablet, TAKE 1 TABLET (6.25 MG TOTAL) BY MOUTH 2 (TWO) TIMES DAILY WITH A MEAL. REPORTED ON 01/06/2016, Disp: 180 tablet, Rfl: 3 .  losartan-hydrochlorothiazide (HYZAAR) 50-12.5 MG tablet, TAKE 1 TABLET BY MOUTH EVERY DAY, Disp: 90 tablet, Rfl: 3 .  omeprazole (PRILOSEC) 40 MG capsule, TAKE 1 CAPSULE BY MOUTH EVERY DAY, Disp: 90 capsule, Rfl: 3  Allergies  Allergen Reactions  . Aspirin     Upset stomach, gets hives     Review of Systems Constitutional: -fever, -chills, -sweats, -unexpected weight change, -decreased appetite, -fatigue Allergy: -sneezing, -itching, -congestion Dermatology: -changing moles, --rash, -lumps ENT: -runny nose, -ear pain, -sore throat, -hoarseness, -sinus pain, -teeth pain, - ringing in ears, -hearing loss, -nosebleeds Cardiology: -chest pain, -palpitations, -swelling, -difficulty breathing when lying flat, -waking up short of breath Respiratory: -cough, -shortness of breath, -difficulty breathing with exercise or exertion, -wheezing, -coughing up blood Gastroenterology: -abdominal pain, -nausea, -vomiting, -diarrhea, -constipation, -blood in stool, -changes in bowel movement, -difficulty swallowing or eating Hematology: -bleeding, -bruising  Musculoskeletal: -joint aches, -muscle aches, -joint swelling, -back pain, -neck  pain, -cramping, -changes in gait Ophthalmology: denies vision changes, eye redness, itching, discharge Urology: -burning with urination, -difficulty urinating, -blood in urine, -urinary frequency, -urgency, -incontinence Neurology: -headache, -weakness, -tingling, -numbness, -memory loss, -falls, -dizziness Psychology: -depressed mood, -agitation, -sleep problems Male GU: no testicular mass, pain, no lymph nodes swollen, no swelling, no rash.     Objective:  BP 128/82   Pulse 96   Temp 98.3 F (36.8 C) (Oral)   Ht 5\' 11"  (1.803 m)   Wt 238 lb 9.6 oz (108.2 kg)   SpO2 98%   BMI 33.28 kg/m  General appearance: alert, no distress, WD/WN, African American male Skin: right upper abdomen with brown flat birth mark, approx 8cm x 5cm, scattere macules, large spongy lipoma right mid to low back approx 10-11 cm diam, unchanged, no other worrisome lesions HEENT: normocephalic, conjunctiva/corneas normal, sclerae anicteric, PERRLA, EOMi, nares patent, no discharge or erythema, pharynx normal Oral cavity: MMM, tongue normal, teeth normal Neck: supple, no lymphadenopathy, no thyromegaly, no masses, normal ROM, no bruits Chest: non tender, normal shape and expansion Heart: RRR, normal S1, S2, no murmurs Lungs: CTA bilaterally, no wheezes, rhonchi, or rales Abdomen: +bs, soft, non tender, non distended, no masses, no hepatomegaly, no splenomegaly, no bruits Back: non tender, normal ROM, no scoliosis Musculoskeletal: upper extremities non tender, no obvious deformity, normal ROM throughout, lower extremities non tender, no obvious deformity, normal ROM throughout Extremities: no edema, no cyanosis, no clubbing Pulses: 2+ symmetric, upper and lower extremities, normal cap refill Neurological: alert, oriented x 3, CN2-12 intact, strength normal upper extremities and lower extremities, sensation normal throughout, DTRs 2+ throughout, no cerebellar signs, gait normal Psychiatric: normal affect,  behavior normal, pleasant  GU: normal male external genitalia,circumcised, nontender, no masses, no hernia, no lymphadenopathy Rectal: anus normal tone, prostate moderately enlarged, no nodules   Assessment and Plan :   Encounter Diagnoses  Name Primary?  . Routine general medical examination at a health care facility Yes  . Essential hypertension   . Gastroesophageal reflux disease without esophagitis   . Bruxism, sleep-related   . Impaired fasting blood sugar   . Neuropathy   . Snoring   . Erectile dysfunction, unspecified erectile dysfunction type   . Dyslipidemia   . Former smoker   . Vaccine counseling   . Macular degeneration, unspecified laterality, unspecified type   . Class 1 obesity with serious comorbidity and body mass index (BMI) of 32.0 to 32.9 in adult, unspecified obesity type   . Screening for prostate cancer   . Burning sensation of feet   . Need for influenza vaccination   . Elevated uric acid in blood   . Aortic atherosclerosis ([place])   . Birth mark   . Lipoma of torso   . Benign prostatic hyperplasia without lower urinary tract symptoms    Expressed sympathy for the recent loss of his father  Physical exam - discussed and counseled on healthy lifestyle, diet, exercise, preventative care, vaccinations, sick and well care, proper use of emergency dept and after hours care, and addressed their concerns.    Health screening: See your eye doctor yearly for routine vision care. See your dentist yearly for routine dental care including hygiene visits twice yearly.  Cancer screening Advised monthly self testicular exam  Colonoscopy:  Reviewed colonoscopy on file that is up to date  Discussed PSA, prostate exam, and prostate cancer screening risks/benefits.     Vaccinations: Counseled on the influenza virus vaccine.  Vaccine information sheet given.  Influenza vaccine given after consent obtained.  Shingles vaccine:  I recommend you have a shingles vaccine  to help prevent shingles or herpes zoster outbreak.   Please call your insurer to inquire about coverage for the Shingrix vaccine given in 2 doses.   Some insurers cover this vaccine after age 31, some cover this after age 36.  If your insurer covers this, then call to schedule appointment to have this vaccine here.  Up to date on Td vaccine   Separate significant chronic issues discussed: Hypertension-continue current medications, routine labs today, reviewed 2017 EKG and cardiac  echocardiogram  Hyperlipidemia-we increase his statin earlier in the year.  Recheck labs today fasting  Congratulated him on weight loss but encouraged him to continue efforts to lose weight through healthy diet and exercise.  Consider sleep study given history of snoring hypertension  Lipoma-unchanged  Birthmark-unchanged  Enlarged prostate without symptoms currently  ED-no current concerns  Burning sensation in his feet, neuropathy-reviewed neurology consult notes from May 2020 with Dr. Brett Fairy.  He continues to do fine on gabapentin.  He also notes improvements after he changed footwear and stop drinking soda  Elevated uric acid in blood-recheck labs today  Aortic atherosclerosis-continue statin, work on healthy diet and exercise  History of macular degeneration-advised routine follow-up with eye doctor   Ikey was seen today for annual exam.  Diagnoses and all orders for this visit:  Routine general medical examination at a health care facility -     Renal Function Panel -     PSA -     Lipid panel -     Microalbumin/Creatinine Ratio, Urine -     Hemoglobin A1c -     Uric acid  Essential hypertension  Gastroesophageal reflux disease without esophagitis  Bruxism, sleep-related  Impaired fasting blood sugar  Neuropathy  Snoring  Erectile dysfunction, unspecified erectile dysfunction type  Dyslipidemia -     Lipid panel  Former smoker  Vaccine counseling  Macular  degeneration, unspecified laterality, unspecified type  Class 1 obesity with serious comorbidity and body mass index (BMI) of 32.0 to 32.9 in adult, unspecified obesity type  Screening for prostate cancer -     PSA  Burning sensation of feet  Need for influenza vaccination  Elevated uric acid in blood -     Uric acid  Aortic atherosclerosis ([place])  Birth mark  Lipoma of torso  Benign prostatic hyperplasia without lower urinary tract symptoms  Other orders -     Flu Vaccine QUAD 6+ mos PF IM (Fluarix Quad PF)    Follow-up pending labs, yearly for physical

## 2019-05-13 NOTE — Patient Instructions (Addendum)
Thanks for trusting [place] with your health care and for coming in for a physical today.  Below are some general recommendations I have for you:  Yearly screenings See your eye doctor yearly for routine vision care. See your dentist yearly for routine dental care including hygiene visits twice yearly. See me here yearly for a routine physical and preventative care visit   Specific Concerns today:  . Continue your current medications . Work on losing weight through [employer] and exercise . Get to exercising regularly . Consider sleep study, check insurance coverage for this given history of hypertension and snoring . Shingles vaccine:  I recommend you have a shingles vaccine to help prevent shingles or herpes zoster outbreak.   Please call your insurer to inquire about coverage for the Shingrix vaccine given in 2 doses.   Some insurers cover this vaccine after age 32, some cover this after age 76.  If your insurer covers this, then call to schedule appointment to have this vaccine here. . Your kidney marker has been elevated the last couple times we have checked it which is likely due to chronic hypertension.  Recheck on this today with labs. . Avoid things that can harm the kidneys such as ibuprofen, Aleve, BC powders, Advil, Motrin, and other anti-inflammatories   Exercise: I recommend exercising most days of the week using a type of exercise that you would enjoy and stick to such as walking, running, swimming, hiking, biking, aerobics, etc. This needs to be at least 30-40 minutes at a time, at least 5 days/week with moderate intensity.  This would be 60-70% of your maximum heart rate.  For example, your maximal heart rate would be 200- your age, multiplied x 0.6 to equal 60% of your maximal heart rate.    Thus, a person age 73 would have a maximum heart rate of 160.  60% of this would be 96 beats per minute.  Thus for fat burning exercise, a 62 year old would want to exercise has sustained  heart rate of about 96 bpm for fat burning.  However for heart health, they would want to exercise at a higher heart rate of about 75% of maximum heart rate which would be a heart rate of about 120 beats per minute.  So I recommend a combination of doing some exercise at moderate intensity, and some exercise at vigorous intensity for heart health.   Low Carb Diet recommendations:  I recommend you drink water throughout the day.   70 ounces or 2 liters would be a good amount.  If you have been accustomed to drinking juice or soda, try water with lemon or water with lime, or try using no calorie flavor dropper.  I recommend the following as an example meal plan that includes 3 meals per day.   You can skip some meals periodically for intermittent fasting.  Breakfast (choose one): [employer] Omelette with egg, can include a little bit of cheese, your choice of mushrooms, peppers, onions, salsa . Smoothie with handful of spinach or kale, 1 cup of milk or water, 1 cup of berries, 1 packet of artificial sweetener such as stevia . Yogurt with fruit   Mid morning snack: . 1 fruit serving and 1 protein such as 8 almonds or 8 nuts, or vegetable such as carrots and humus or other similar vegetable   Lunch: . Salad with 3-4 ounces of lean grilled or baked meat such as fish, chicken or [place]   Mid afternoon snack: . 1 fruit  serving and 1 protein such as 8 almonds or 8 nuts, or vegetable such as carrots and humus or other similar vegetable   Dinner: . Large serving of vegetables and 3-4 ounces of lean grilled or baked meat such as fish, chicken or [place] . Or vegetarian dish without meat    Avoid  . Chips, cookies, cake, donuts, soda, sweet tea, juices, candy, fast food . For now , avoid, or significantly limit grains   Please follow up yearly for a physical.   Preventative Care for Adults - Male      [place]:  A routine yearly physical is a good way to check in with your  primary care provider about your health and preventive screening. It is also an opportunity to share updates about your health and any concerns you have, and receive a thorough all-over exam.   Most health insurance companies pay for at least some preventative services.  Check with your health plan for specific coverages.  WHAT PREVENTATIVE SERVICES DO MEN NEED?  Adult men should have their weight and blood pressure checked regularly.   Men age 47 and older should have their cholesterol levels checked regularly.  Beginning at age 24 and continuing to age 2, men should be screened for colorectal cancer.  Certain people may need continued testing until age 79.  Updating vaccinations is part of preventative care.  Vaccinations help protect against diseases such as the flu.  Osteoporosis is a disease in which the bones lose minerals and strength as we age. Men ages 58 and over should discuss this with their caregivers  Lab tests are generally done as part of preventative care to screen for anemia and blood disorders, to screen for problems with the kidneys and liver, to screen for bladder problems, to check blood sugar, and to check your cholesterol level.  Preventative services generally include counseling about diet, exercise, avoiding tobacco, drugs, excessive alcohol consumption, and sexually transmitted infections.    GENERAL RECOMMENDATIONS FOR GOOD HEALTH:  Healthy diet:  Eat a variety of foods, including fruit, vegetables, animal or vegetable protein, such as meat, fish, chicken, and eggs, or beans, lentils, tofu, and grains, such as rice.  Drink plenty of water daily.  Decrease saturated fat in the diet, avoid lots of red meat, processed foods, sweets, fast foods, and fried foods.  Exercise:  Aerobic exercise helps maintain good heart health. At least 30-40 minutes of moderate-intensity exercise is recommended. For example, a brisk walk that increases your heart rate and  breathing. This should be done on most days of the week.   Find a type of exercise or a variety of exercises that you enjoy so that it becomes a part of your daily life.  Examples are running, walking, swimming, water aerobics, and biking.  For motivation and support, explore group exercise such as aerobic class, spin class, Zumba, Yoga,or  martial arts, etc.    Set exercise goals for yourself, such as a certain weight goal, walk or run in a race such as a 5k walk/run.  Speak to your primary care provider about exercise goals.  Disease prevention:  If you smoke or chew tobacco, find out from your caregiver how to quit. It can literally save your life, no matter how long you have been a tobacco user. If you do not use tobacco, never begin.   Maintain a healthy diet and normal weight. Increased weight leads to problems with blood pressure and diabetes.   The Body  Mass Index or BMI is a way of measuring how much of your body is fat. Having a BMI above 27 increases the risk of heart disease, diabetes, hypertension, stroke and other problems related to obesity. Your caregiver can help determine your BMI and based on it develop an exercise and dietary program to help you achieve or maintain this important measurement at a healthful level.  High blood pressure causes heart and blood vessel problems.  Persistent high blood pressure should be treated with medicine if weight loss and exercise do not work.   Fat and cholesterol leaves deposits in your arteries that can block them. This causes heart disease and vessel disease elsewhere in your body.  If your cholesterol is found to be high, or if you have heart disease or certain other medical conditions, then you may need to have your cholesterol monitored frequently and be treated with medication.   Ask if you should have a cardiac stress test if your history suggests this. A stress test is a test done on a treadmill that looks for heart disease. This test  can find disease prior to there being a problem.  Osteoporosis is a disease in which the bones lose minerals and strength as we age. This can result in serious bone fractures. Risk of osteoporosis can be identified using a bone density scan. Men ages 72 and over should discuss this with their caregivers. Ask your caregiver whether you should be taking a calcium supplement and Vitamin D, to reduce the rate of osteoporosis.   Avoid drinking alcohol in excess (more than two drinks per day).  Avoid use of street drugs. Do not share needles with anyone. Ask for professional help if you need assistance or instructions on stopping the use of alcohol, cigarettes, and/or drugs.  Brush your teeth twice a day with fluoride toothpaste, and floss once a day. Good oral hygiene prevents tooth decay and gum disease. The problems can be painful, unattractive, and can cause other health problems. Visit your dentist for a routine oral and dental check up and preventive care every 6-12 months.   Look at your skin regularly.  Use a mirror to look at your back. Notify your caregivers of changes in moles, especially if there are changes in shapes, colors, a size larger than a pencil eraser, an irregular border, or development of new moles.  Safety:  Use seatbelts 100% of the time, whether driving or as a passenger.  Use safety devices such as hearing protection if you work in environments with loud noise or significant background noise.  Use safety glasses when doing any work that could send debris in to the eyes.  Use a helmet if you ride a bike or motorcycle.  Use appropriate safety gear for contact sports.  Talk to your caregiver about gun safety.  Use sunscreen with a SPF (or skin protection factor) of 15 or greater.  Lighter skinned people are at a greater risk of skin cancer. Don't forget to also wear sunglasses in order to protect your eyes from too much damaging sunlight. Damaging sunlight can accelerate cataract  formation.   Practice safe sex. Use condoms. Condoms are used for birth control and to help reduce the spread of sexually transmitted infections (or STIs).  Some of the STIs are gonorrhea (the clap), chlamydia, syphilis, trichomonas, herpes, HPV (human papilloma virus) and HIV (human immunodeficiency virus) which causes AIDS. The herpes, HIV and HPV are viral illnesses that have no cure. These can result in disability,  cancer and death.   Keep carbon monoxide and smoke detectors in your home functioning at all times. Change the batteries every 6 months or use a model that plugs into the wall.   Vaccinations:  Stay up to date with your tetanus shots and other required immunizations. You should have a booster for tetanus every 10 years. Be sure to get your flu shot every year, since 5%-20% of the [place] population comes down with the flu. The flu vaccine changes each year, so being vaccinated once is not enough. Get your shot in the fall, before the flu season peaks.   Other vaccines to consider:  Human Papilloma Virus or HPV causes cancer of the cervix, and other infections that can be transmitted from person to person. There is a vaccine for HPV, and males should get immunized between the ages of 39 and 63. It requires a series of 3 shots.   Pneumococcal vaccine to protect against certain types of pneumonia.  This is normally recommended for adults age 20 or older.  However, adults younger than 62 years old with certain underlying conditions such as diabetes, heart or lung disease should also receive the vaccine.  Shingles vaccine to protect against Varicella Zoster if you are older than age 40, or younger than 62 years old with certain underlying illness.  If you have not had the Shingrix vaccine, please call your insurer to inquire about coverage for the Shingrix vaccine given in 2 doses.   Some insurers cover this vaccine after age 88, some cover this after age 48.  If your insurer covers this, then  call to schedule appointment to have this vaccine here  Hepatitis A vaccine to protect against a form of infection of the liver by a virus acquired from food.  Hepatitis B vaccine to protect against a form of infection of the liver by a virus acquired from blood or body fluids, particularly if you work in health care.  If you plan to travel internationally, check with your local health department for specific vaccination recommendations.   What should I know about cancer screening? Many types of cancers can be detected early and may often be prevented. Lung Cancer  You should be screened every year for lung cancer if: ? You are a current smoker who has smoked for at least 30 years. ? You are a former smoker who has quit within the past 15 years.  Talk to your health care provider about your screening options, when you should start screening, and how often you should be screened.  Colorectal Cancer  Routine colorectal cancer screening usually begins at 62 years of age and should be repeated every 5-10 years until you are 62 years old. You may need to be screened more often if early forms of precancerous polyps or small growths are found. Your health care provider may recommend screening at an earlier age if you have risk factors for colon cancer.  Your health care provider may recommend using home test kits to check for hidden blood in the stool.  A small camera at the end of a tube can be used to examine your colon (sigmoidoscopy or colonoscopy). This checks for the earliest forms of colorectal cancer.  Prostate and Testicular Cancer  Depending on your age and overall health, your health care provider may do certain tests to screen for prostate and testicular cancer.  Talk to your health care provider about any symptoms or concerns you have about testicular or prostate cancer.  Skin Cancer  Check your skin from head to toe regularly.  Tell your health care provider about any new  moles or changes in moles, especially if: ? There is a change in a mole's size, shape, or color. ? You have a mole that is larger than a pencil eraser.  Always use sunscreen. Apply sunscreen liberally and repeat throughout the day.  Protect yourself by wearing long sleeves, pants, a wide-brimmed hat, and sunglasses when outside.

## 2019-05-14 ENCOUNTER — Other Ambulatory Visit: Payer: Self-pay | Admitting: Medical

## 2019-05-14 DIAGNOSIS — R7989 Other specified abnormal findings of blood chemistry: Secondary | ICD-10-CM

## 2019-05-14 LAB — MICROALBUMIN / CREATININE URINE RATIO
Creatinine, Urine: 247.6 mg/dL
Microalb/Creat Ratio: 3 mg/g creat (ref 0–29)
Microalbumin, Urine: 6.2 ug/mL

## 2019-05-14 LAB — LIPID PANEL
Chol/HDL Ratio: 4.3 ratio (ref 0.0–5.0)
Cholesterol, Total: 178 mg/dL (ref 100–199)
HDL: 41 mg/dL (ref >39)
LDL Chol Calc (NIH): 115 mg/dL — ABNORMAL HIGH (ref 0–99)
Triglycerides: 123 mg/dL (ref 0–149)
VLDL Cholesterol Cal: 22 mg/dL (ref 5–40)

## 2019-05-14 LAB — PSA: Prostate Specific Ag, Serum: 0.9 ng/mL (ref 0.0–4.0)

## 2019-05-14 LAB — HEMOGLOBIN A1C
Est. average glucose Bld gHb Est-mCnc: 123 mg/dL
Hgb A1c MFr Bld: 5.9 % — ABNORMAL HIGH (ref 4.8–5.6)

## 2019-05-14 LAB — RENAL FUNCTION PANEL
Albumin: 4.6 g/dL (ref 3.8–4.8)
BUN/Creatinine Ratio: 11 (ref 10–24)
BUN: 15 mg/dL (ref 8–27)
CO2: 22 mmol/L (ref 20–29)
Calcium: 9.8 mg/dL (ref 8.6–10.2)
Chloride: 103 mmol/L (ref 96–106)
Creatinine, Ser: 1.4 mg/dL — ABNORMAL HIGH (ref 0.76–1.27)
GFR calc Af Amer: 62 mL/min/{1.73_m2} (ref >59)
GFR calc non Af Amer: 53 mL/min/{1.73_m2} — ABNORMAL LOW (ref >59)
Glucose: 108 mg/dL — ABNORMAL HIGH (ref 65–99)
Phosphorus: 2.8 mg/dL (ref 2.8–4.1)
Potassium: 4.1 mmol/L (ref 3.5–5.2)
Sodium: 141 mmol/L (ref 134–144)

## 2019-05-14 LAB — URIC ACID: Uric Acid: 7 mg/dL (ref 3.7–8.6)

## 2019-05-14 MED ORDER — GABAPENTIN 300 MG PO CAPS: ORAL_CAPSULE | ORAL | 3 refills | Status: DC

## 2019-05-14 MED ORDER — AMLODIPINE BESYLATE 5 MG PO TABS: 5.0000 mg | ORAL_TABLET | Freq: Every day | ORAL | 3 refills | Status: DC

## 2019-05-14 MED ORDER — LOSARTAN POTASSIUM 50 MG PO TABS: 50.0000 mg | ORAL_TABLET | Freq: Every day | ORAL | 0 refills | Status: DC

## 2019-05-14 MED ORDER — ROSUVASTATIN CALCIUM 40 MG PO TABS: 40.0000 mg | ORAL_TABLET | Freq: Every day | ORAL | 3 refills | Status: DC

## 2019-06-16 ENCOUNTER — Other Ambulatory Visit: Payer: 59

## 2019-06-16 ENCOUNTER — Other Ambulatory Visit: Payer: 59 | Admitting: Family Medicine

## 2019-06-16 ENCOUNTER — Other Ambulatory Visit: Payer: Self-pay

## 2019-06-16 DIAGNOSIS — R7989 Other specified abnormal findings of blood chemistry: Secondary | ICD-10-CM

## 2019-06-16 LAB — BASIC METABOLIC PANEL
BUN/Creatinine Ratio: 13 (ref 10–24)
BUN: 17 mg/dL (ref 8–27)
CO2: 23 mmol/L (ref 20–29)
Calcium: 9.3 mg/dL (ref 8.6–10.2)
Chloride: 107 mmol/L — ABNORMAL HIGH (ref 96–106)
Creatinine, Ser: 1.3 mg/dL — ABNORMAL HIGH (ref 0.76–1.27)
GFR calc Af Amer: 68 mL/min/{1.73_m2} (ref >59)
GFR calc non Af Amer: 58 mL/min/{1.73_m2} — ABNORMAL LOW (ref >59)
Glucose: 92 mg/dL (ref 65–99)
Potassium: 4.3 mmol/L (ref 3.5–5.2)
Sodium: 141 mmol/L (ref 134–144)

## 2019-08-18 ENCOUNTER — Other Ambulatory Visit: Payer: Self-pay | Admitting: Medical

## 2019-10-07 IMAGING — MR MRI THORACIC SPINE WITHOUT CONTRAST
4 of 11 series · 21 of 48 positions shown · non-contrast
Comparison: Plain films lumbar spine 11/20/2018.

CLINICAL DATA: Mid and low back pain radiating into the left hip.
Bilateral leg numbness and foot pain. Symptoms for 5 years. The
patient also reports a painless but enlarging lump near the
thoracolumbar junction for the past 5 years.

EXAM:
MRI THORACIC AND LUMBAR SPINE WITHOUT CONTRAST
TECHNIQUE: Multiplanar and multiecho pulse sequences of the thoracic and lumbar
spine were obtained without intravenous contrast.

[Series 18: T2 · sagittal · 3.5mm · 0.89mm/px · 3 of 15 slices shown (1 of 4)]
[im 1/15]
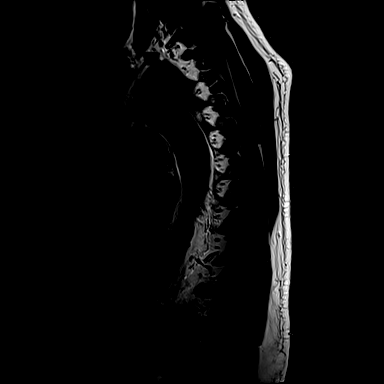
[im 8/15]
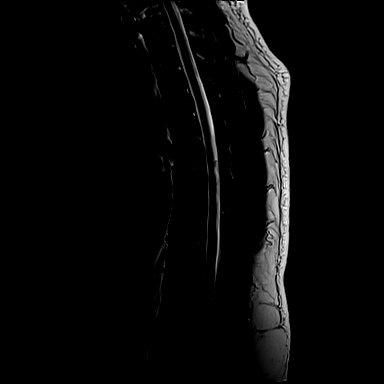
[im 15/15]
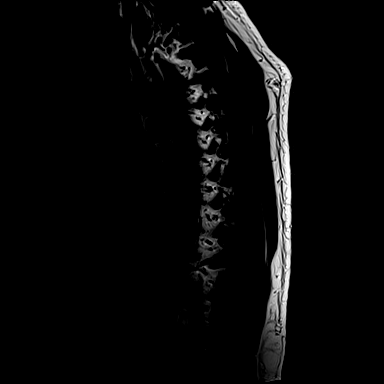

[Series 20: T2 · axial · 4.0mm · 0.39mm/px · z∈[-500,-244]mm · 7 of 42 slices shown (2 of 4)]
[im 1/42]
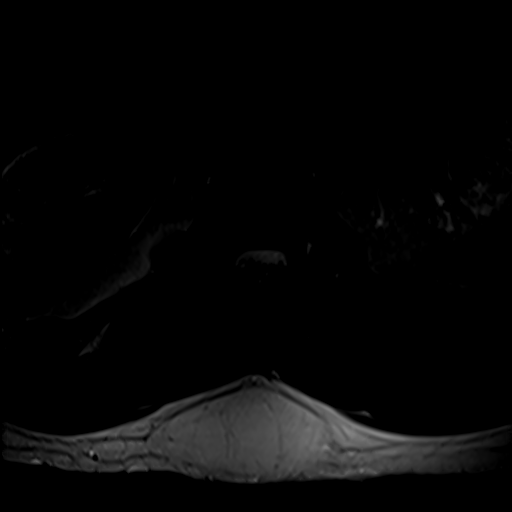
[im 7/42]
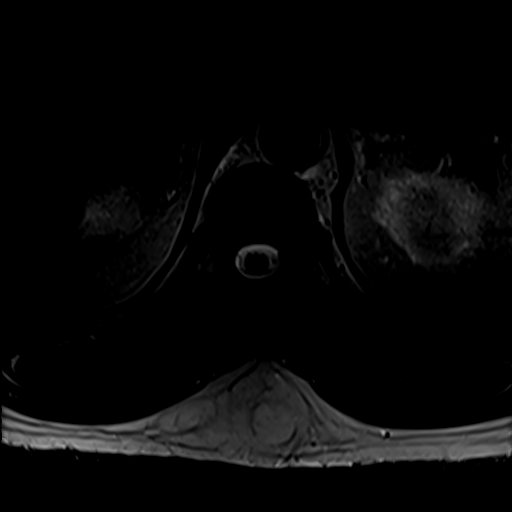
[im 14/42]
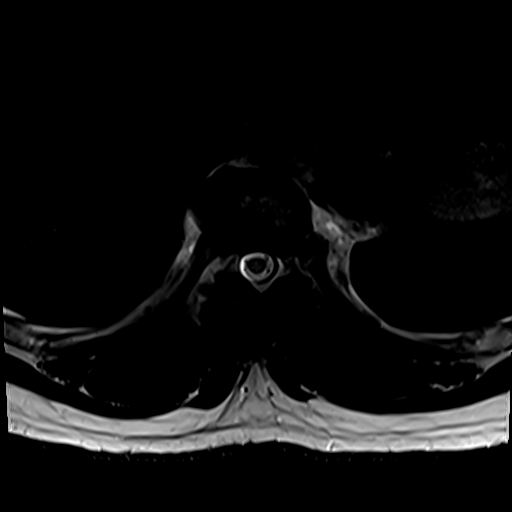
[im 21/42]
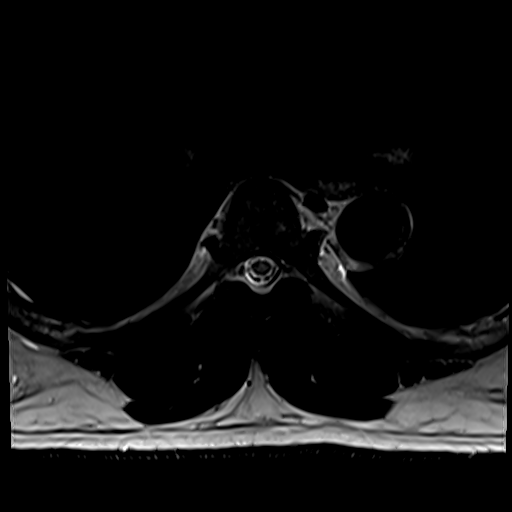
[im 28/42]
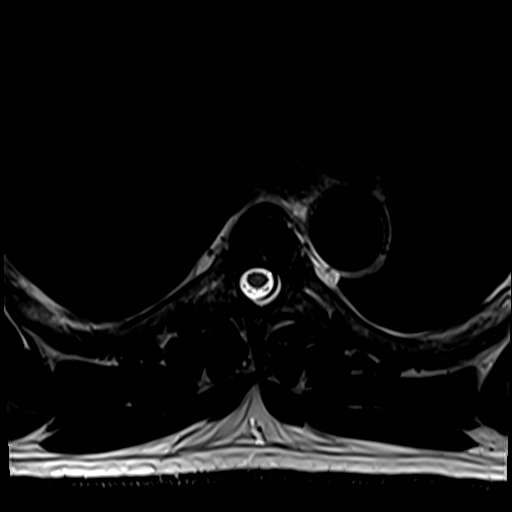
[im 35/42]
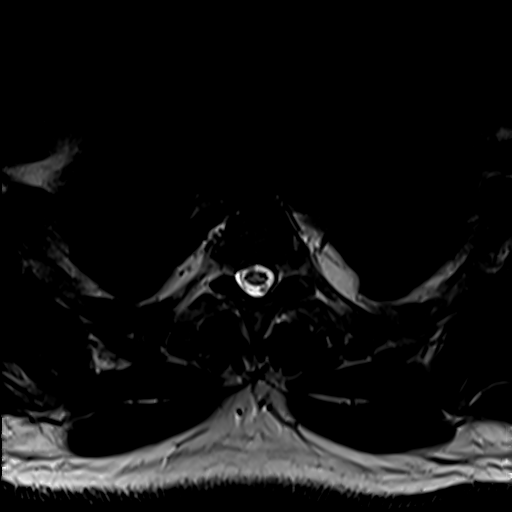
[im 42/42]
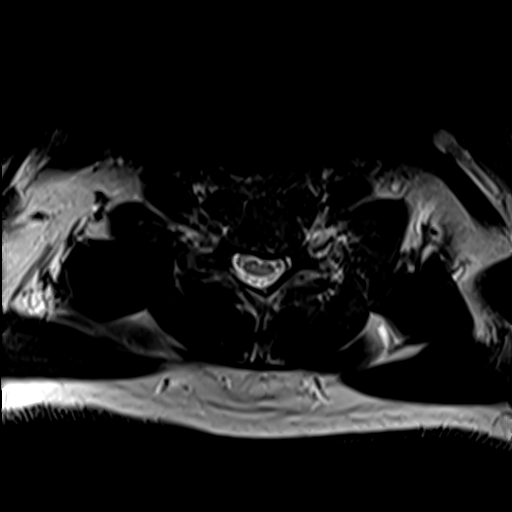

[Series 24: T2 · sagittal · 4.0mm · 0.88mm/px · 3 of 15 slices shown (3 of 4)]
[im 1/15]
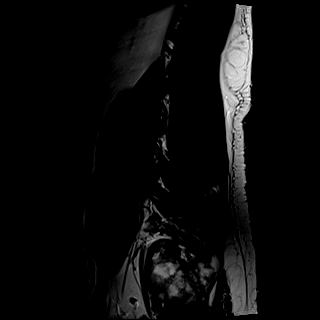
[im 8/15]
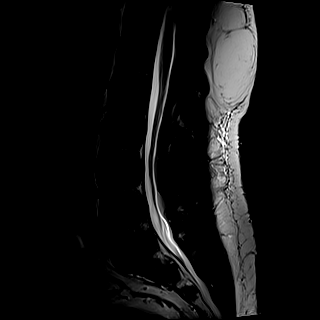
[im 15/15]
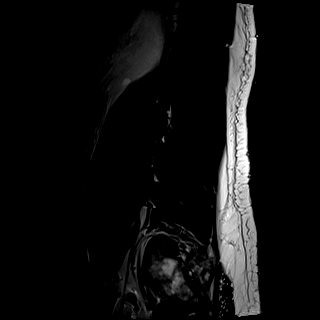

[Series 31: T2 · axial · 4.0mm · 0.39mm/px · z∈[-701,-456]mm · 8 of 44 slices shown (4 of 4)]
[im 1/44]
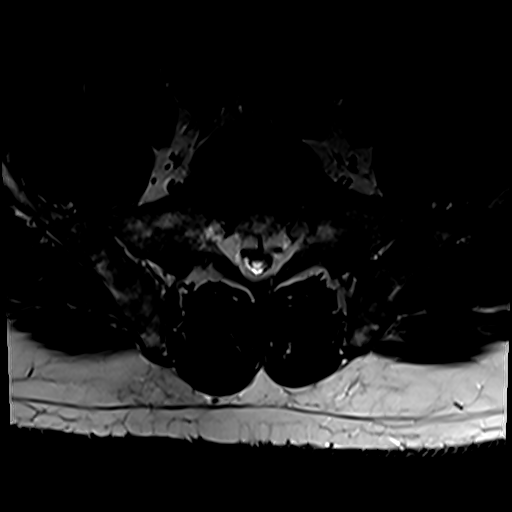
[im 7/44]
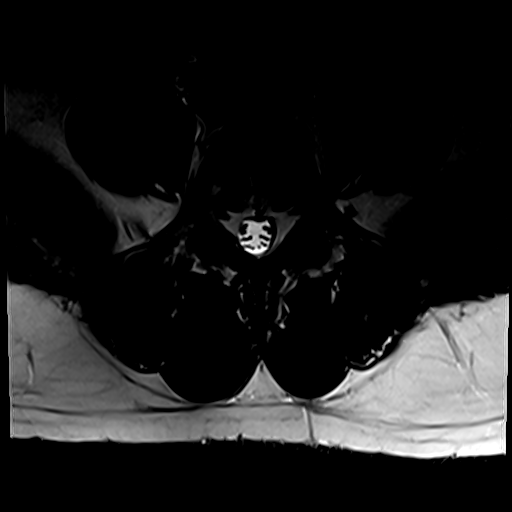
[im 13/44]
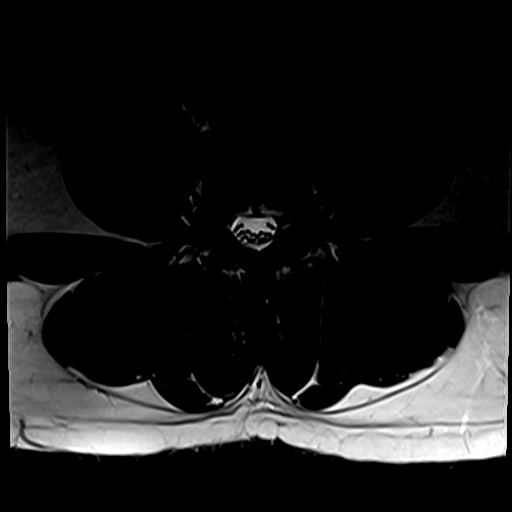
[im 19/44]
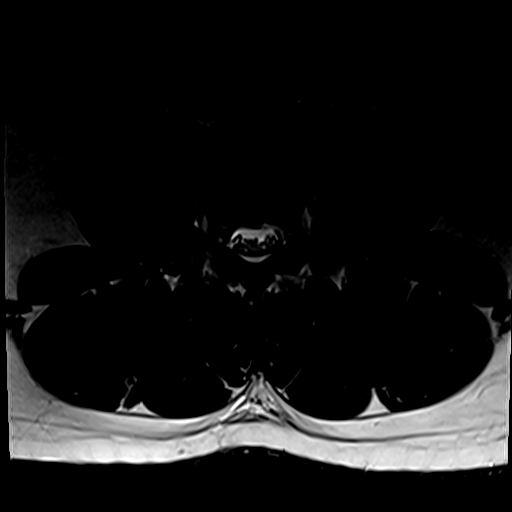
[im 25/44]
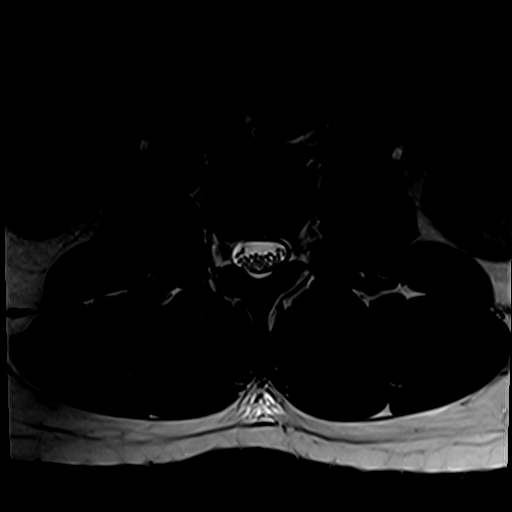
[im 31/44]
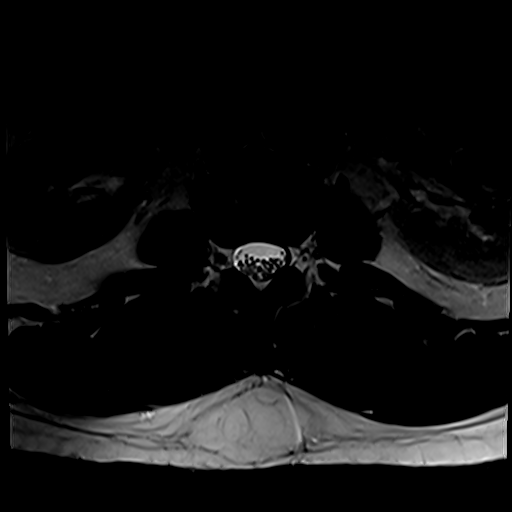
[im 37/44]
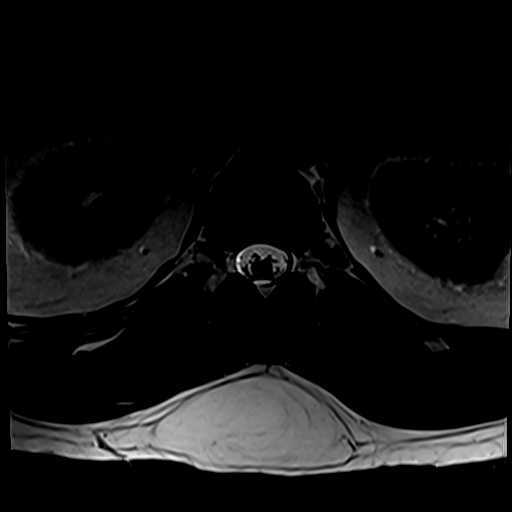
[im 44/44]
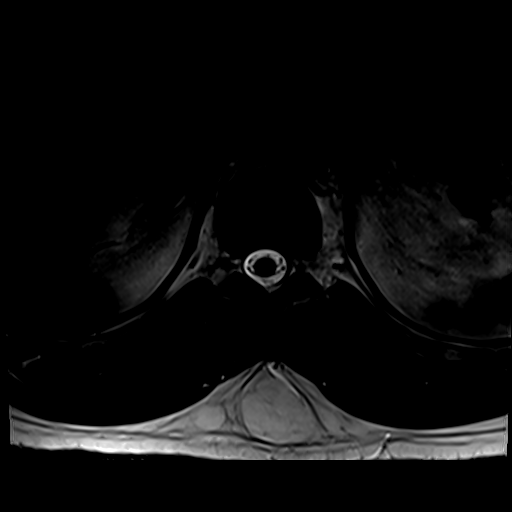

[21 of 48 positions shown; findings below may reference images not displayed]

FINDINGS: MRI THORACIC SPINE FINDINGS

Alignment: Maintained throughout the thoracic spine. [facility]
demonstrates 0.4 cm anterolisthesis C5 on C6 due to facet
arthropathy.

Vertebrae: Marrow signal is mildly heterogeneous but no fracture or
worrisome lesion. [facility] imaging shows degenerative endplate signal
change at C5-6.

Cord:  Normal signal throughout.

Paraspinal and other soft tissues: Negative.

Disc levels:

No disc bulge or protrusion is identified in the thoracic spine. The
central canal and foramina widely patent throughout. Shallow disc
bulge to the left at C7-T1 is identified without central canal
stenosis.

MRI LUMBAR SPINE FINDINGS

Segmentation:  Standard.

Alignment:  Normal.

Vertebrae:  No fracture or worrisome lesion.

Conus medullaris and cauda equina: Conus extends to the L1-2 level.
Conus and cauda equina appear normal.

Paraspinal and other soft tissues: Markers are placed about the
region of concern over the midline of the posterior back. Between
the markers, there is a T1 hyperintense subcutaneous lesion
measuring approximately 3.7 cm AP 8 cm transverse by 10 cm
craniocaudal. The lesion has a few fine septations within and
demonstrates complete signal dropout on fat saturation sequences.
Imaged intra-abdominal contents are unremarkable.

Disc levels:

L1-2: Negative.

L2-3: Negative.

L3-4: Negative.

L4-5: There is a shallow disc bulge to the right and a tiny annular
fissure on the right. No stenosis.

L5-S1: Minimal disc bulge without stenosis.
IMPRESSION: MR THORACIC SPINE IMPRESSION

Negative for thoracic spondylosis. The central canal and foramina
are widely patent throughout.

Incomplete evaluation of cervical spondylosis appearing worst at
C5-6 where facet degenerative disease results in 0.3 cm
anterolisthesis. The cervical spine is seen on [facility] only.

MR LUMBAR SPINE IMPRESSION

Mild lumbar degenerative disease at L4-5 there is a shallow disc
bulge. The central canal and foramina are widely patent throughout.

Lesion in the subcutaneous tissues posterior to the lumbar spine has
an appearance most consistent with a simple lipoma.

## 2019-11-12 ENCOUNTER — Other Ambulatory Visit: Payer: Self-pay | Admitting: Medical

## 2020-02-18 ENCOUNTER — Other Ambulatory Visit: Payer: Self-pay

## 2020-02-18 ENCOUNTER — Telehealth: Payer: Self-pay

## 2020-02-18 MED ORDER — LOSARTAN POTASSIUM 50 MG PO TABS: 50.0000 mg | ORAL_TABLET | Freq: Every day | ORAL | 0 refills | Status: DC

## 2020-02-18 NOTE — Telephone Encounter (Signed)
Medication has been sent.  

## 2020-02-18 NOTE — Telephone Encounter (Signed)
I received a fax from [facility] for a refill on the pts. Losartan 50mg  pt. Last apt. 05/13/19 cpe next apt 05/13/20 cpe

## 2020-04-29 ENCOUNTER — Other Ambulatory Visit: Payer: Self-pay | Admitting: Medical

## 2020-04-29 NOTE — Telephone Encounter (Signed)
He is due for physical in September.  Please schedule fasting physical.  Please send 90-day without refills on both medications

## 2020-04-30 NOTE — Telephone Encounter (Signed)
Patient has an appointment scheduled.  °

## 2020-05-13 ENCOUNTER — Encounter: Payer: Self-pay | Admitting: Medical

## 2020-05-13 ENCOUNTER — Ambulatory Visit: Payer: 59 | Admitting: Medical

## 2020-05-13 ENCOUNTER — Other Ambulatory Visit: Payer: Self-pay

## 2020-05-13 VITALS — BP 122/80 | HR 83 | Ht 71.0 in | Wt 242.2 lb

## 2020-05-13 DIAGNOSIS — E790 Hyperuricemia without signs of inflammatory arthritis and tophaceous disease: Secondary | ICD-10-CM

## 2020-05-13 DIAGNOSIS — H3530 Unspecified macular degeneration: Secondary | ICD-10-CM

## 2020-05-13 DIAGNOSIS — Z23 Encounter for immunization: Secondary | ICD-10-CM

## 2020-05-13 DIAGNOSIS — Z0000 Encounter for general adult medical examination without abnormal findings: Secondary | ICD-10-CM | POA: Diagnosis not present

## 2020-05-13 DIAGNOSIS — R0683 Snoring: Secondary | ICD-10-CM

## 2020-05-13 DIAGNOSIS — R7301 Impaired fasting glucose: Secondary | ICD-10-CM

## 2020-05-13 DIAGNOSIS — M2550 Pain in unspecified joint: Secondary | ICD-10-CM

## 2020-05-13 DIAGNOSIS — K219 Gastro-esophageal reflux disease without esophagitis: Secondary | ICD-10-CM | POA: Diagnosis not present

## 2020-05-13 DIAGNOSIS — E785 Hyperlipidemia, unspecified: Secondary | ICD-10-CM

## 2020-05-13 DIAGNOSIS — N400 Enlarged prostate without lower urinary tract symptoms: Secondary | ICD-10-CM

## 2020-05-13 DIAGNOSIS — N529 Male erectile dysfunction, unspecified: Secondary | ICD-10-CM

## 2020-05-13 DIAGNOSIS — D171 Benign lipomatous neoplasm of skin and subcutaneous tissue of trunk: Secondary | ICD-10-CM

## 2020-05-13 DIAGNOSIS — Z7189 Other specified counseling: Secondary | ICD-10-CM

## 2020-05-13 DIAGNOSIS — I700 Atherosclerosis of aorta: Secondary | ICD-10-CM

## 2020-05-13 DIAGNOSIS — G479 Sleep disorder, unspecified: Secondary | ICD-10-CM

## 2020-05-13 DIAGNOSIS — Z125 Encounter for screening for malignant neoplasm of prostate: Secondary | ICD-10-CM

## 2020-05-13 DIAGNOSIS — Z7185 Encounter for immunization safety counseling: Secondary | ICD-10-CM

## 2020-05-13 DIAGNOSIS — I10 Essential (primary) hypertension: Secondary | ICD-10-CM

## 2020-05-13 NOTE — Patient Instructions (Signed)
See your eye doctor and dentist yearly  I would consider seeing a different eye doctor for routine surveillance since you may not be happy with the current eye doctor  [place], [place], [place] [address] Phone: [phone] [email or web address]   [facility] Dr. Camillo Flaming [address] [place], [place] [address]  [email or web address]   Continue your current medications  I recommend a low-cholesterol low-fat diet to reduce your risk of heart disease and stroke.  Specifically I would try to limit animal products such as meat cheese and dairy  I also recommend considering intermittent fasting to help lose weight.  I have included some information below about this.  Vaccines: We updated your flu shot today  Shingles vaccine:  I recommend you have a shingles vaccine to help prevent shingles or herpes zoster outbreak.   Please call your insurer to inquire about coverage for the Shingrix vaccine given in 2 doses.   Some insurers cover this vaccine after age 31, some cover this after age 72.  If your insurer covers this, then call to schedule appointment to have this vaccine here.   Given your sleep issues, you may want to consider a sleep study.  These have gotten more affordable now than compared to the past.   Given your hand pain I am checking some labs today.  We may consider baseline x-rays.     Intermittent fasting (IF) is an eating pattern that cycles between periods of fasting and eating. Its currently very popular in the health and fitness community.  Intermittent Fasting Methods There are several different ways of doing intermittent fasting -- all of which involve splitting the day or week into eating and fasting periods.  During the fasting periods, you eat either very little or nothing at all.  These are the most popular methods:  The 16/8  method involves skipping breakfast and restricting your daily eating period to 8 hours, such as 1-9 p.m. Then you fast for 16 hours in between.  Eat-Stop-Eat: This involves fasting for 24 hours, once or twice a week, for example by not eating from dinner one day until dinner the next day.  The 5:2 diet: With this method, you consume only 500-600 calories on two nonconsecutive days of the week, but eat normally the other 5 days.  By reducing your calorie intake, all of these methods should cause weight loss as long as you dont compensate by eating much more during the eating periods.  Many people find the 16/8 method to be the simplest, most sustainable and easiest to stick to. Its also the most popular.

## 2020-05-13 NOTE — Progress Notes (Signed)
Subjective:   HPI  Dave Carpenter is a 64 y.o. male who presents for Chief Complaint  Patient presents with  . Annual Exam    with fasting labs     Patient Care Team: Arlando Leisinger, Leward Quan as PCP - General (Family Medicine) Sees dentist Sees eye doctor Dr. Larey Seat, neurology Dr. Jackquline Denmark, GI Dr. Daneen Schick, cardiology Dr. Joni Fears, ortho   Concerns: He notes stabbing pains down left wrist and right hand from time to time. Pain can be both sides of hand and wrist.   If he goes to pick up a glass, can't squeeze the glass due ot pain.   Ten a little later can pick up in a different hand position but it hurts.  Pains typically stay in hands and lower forearms. At times feels stiff in hands.  Knees hurt from time to time.  No joint swelling.   Feels stiff every morning for a few minutes.    HTN - compliant with Amlodipine 5mg  daily, Carvedilol 6.25mg  BID, losartan 50mg  daily  Hyperlipidemia - compliant with Crestor 40mg  daily  Neuropathy - taking gabapentin daily at bedtime, 300mg .  Legs feel ok of late.  Still has fair amount of numbness sensation in right hip to lower leg.   GERD - uses omeprazole daily.   Reviewed their medical, surgical, family, social, medication, and allergy history and updated chart as appropriate.  Past Medical History:  Diagnosis Date  . Allergy   . Aortic atherosclerosis ([place]) 2017  . Arthritis    knees, shouders, wrists  . Birth mark    upper abdomen  . Elevated uric acid in blood 2018  . Erectile dysfunction   . GERD (gastroesophageal reflux disease)   . H/O cardiovascular stress test 06/2009   normal nuclear stress test, Dr. Quay Burow  . H/O echocardiogram 05/2016   normal LV systolic function, EF 74-25%, grade 1 diastolic dysfunction  . History of seizure    few times as a child, none as an adult  . Hyperlipidemia   . Hypertension   . Impaired fasting blood sugar 2018  . Lipoma of back    since 2007 or earlier   . Macular degeneration   . Snoring 2018   sleep study declined due to cost  . Wears glasses     Past Surgical History:  Procedure Laterality Date  . COLONOSCOPY  06/2018   Mild pancolonic diverticulosis of the sigmoid colon, Dr. Jackquline Denmark;  2008 with Dr. Benson Norway  . ganglion cyst removal     left wrist  . right lung surgery to remove mass  2007   benign  . TONSILLECTOMY      Family History  Problem Relation Age of Onset  . Diabetes Mother   . Stroke Mother   . Other Mother        brain tumor  . Stroke Father   . Diabetes Father   . Heart disease Sister   . Heart disease Brother   . Cancer Neg Hx      Current Outpatient Medications:  .  amLODipine (NORVASC) 5 MG tablet, Take 1 tablet (5 mg total) by mouth daily., Disp: 90 tablet, Rfl: 3 .  carvedilol (COREG) 6.25 MG tablet, TAKE 1 TABLET (6.25 MG TOTAL) BY MOUTH 2 (TWO) TIMES DAILY WITH A MEAL. REPORTED ON 01/06/2016, Disp: 180 tablet, Rfl: 3 .  gabapentin (NEURONTIN) 300 MG capsule, TAKE 1 CAPSULE BY MOUTH EVERYDAY AT BEDTIME, Disp: 90 capsule, Rfl: 3 .  losartan (COZAAR) 50 MG tablet, Take 1 tablet (50 mg total) by mouth daily., Disp: 90 tablet, Rfl: 0 .  omeprazole (PRILOSEC) 40 MG capsule, TAKE 1 CAPSULE BY MOUTH EVERY DAY, Disp: 90 capsule, Rfl: 0 .  rosuvastatin (CRESTOR) 40 MG tablet, TAKE 1 TABLET BY MOUTH EVERY DAY, Disp: 90 tablet, Rfl: 0  Allergies  Allergen Reactions  . Aspirin     Upset stomach, gets hives     Review of Systems Constitutional: -fever, -chills, -sweats, -unexpected weight change, -decreased appetite, -fatigue Allergy: -sneezing, -itching, -congestion Dermatology: -changing moles, --rash, -lumps ENT: -runny nose, -ear pain, -sore throat, -hoarseness, -sinus pain, -teeth pain, - ringing in ears, -hearing loss, -nosebleeds Cardiology: -chest pain, -palpitations, -swelling, -difficulty breathing when lying flat, -waking up short of breath Respiratory: -cough, -shortness of breath, -difficulty  breathing with exercise or exertion, -wheezing, -coughing up blood Gastroenterology: -abdominal pain, -nausea, -vomiting, -diarrhea, -constipation, -blood in stool, -changes in bowel movement, -difficulty swallowing or eating Hematology: -bleeding, -bruising  Musculoskeletal:  +joint aches, -muscle aches, -joint swelling, -back pain, -neck pain, -cramping, -changes in gait Ophthalmology: denies vision changes, eye redness, itching, discharge Urology: -burning with urination, -difficulty urinating, -blood in urine, -urinary frequency, -urgency, -incontinence Neurology: -headache, -weakness, -tingling, +numbness, -memory loss, -falls, -dizziness Psychology: -depressed mood, -agitation, -sleep problems Male GU: no testicular mass, pain, no lymph nodes swollen, no swelling, no rash.     Objective:  BP 122/80   Pulse 83   Ht 5\' 11"  (1.803 m)   Wt 242 lb 3.2 oz (109.9 kg)   SpO2 96%   BMI 33.78 kg/m    Wt Readings from Last 3 Encounters:  05/13/20 242 lb 3.2 oz (109.9 kg)  05/13/19 238 lb 9.6 oz (108.2 kg)  01/22/19 247 lb (112 kg)    General appearance: alert, no distress, WD/WN, African American male Skin: unremarkable, right upper abdomen with brown flat birth mark, approx 8cm x 5cm, scattere macules, large spongy lipoma right mid to low back approx 10-11 cm diam, unchanged, no other worrisome lesions HEENT: normocephalic, conjunctiva/corneas normal, sclerae anicteric, PERRLA, EOMi, nares patent, no discharge or erythema, pharynx normal Oral cavity: MMM, tongue normal, teeth in good repair Neck: supple, no lymphadenopathy, no thyromegaly, no masses, normal ROM, no bruits Chest: non tender, normal shape and expansion Heart: RRR, normal S1, S2, no murmurs Lungs: CTA bilaterally, no wheezes, rhonchi, or rales Abdomen: +bs, soft, non tender, non distended, no masses, no hepatomegaly, no splenomegaly, no bruits Back: non tender, normal ROM, no scoliosis Musculoskeletal: Hands and wrists  with no obvious deformity, nontender to palpation, normal range of motion, no swelling, otherwise upper extremities non tender, no obvious deformity, normal ROM throughout, lower extremities non tender, no obvious deformity, normal ROM throughout Extremities: no edema, no cyanosis, no clubbing Pulses: 2+ symmetric, upper and lower extremities, normal cap refill Neurological: Normal wrist and hand and finger strength and sensation, alert, oriented x 3, CN2-12 intact, strength normal upper extremities and lower extremities, sensation normal throughout, DTRs 2+ throughout, no cerebellar signs, gait normal Psychiatric: normal affect, behavior normal, pleasant  GU: declined Rectal: declined   Assessment and Plan :   Encounter Diagnoses  Name Primary?  . Routine general medical examination at a health care facility Yes  . Benign prostatic hyperplasia without lower urinary tract symptoms   . Gastroesophageal reflux disease without esophagitis   . Impaired fasting blood sugar   . Essential hypertension   . Aortic atherosclerosis ([place])   . Snoring   .  Erectile dysfunction, unspecified erectile dysfunction type   . Dyslipidemia   . Vaccine counseling   . Macular degeneration, unspecified laterality, unspecified type   . Screening for prostate cancer   . Elevated uric acid in blood   . Lipoma of torso   . Need for influenza vaccination   . Polyarthralgia   . Arthralgia, unspecified joint   . Sleep disturbance     Physical exam - discussed and counseled on healthy lifestyle, diet, exercise, preventative care, vaccinations, sick and well care, proper use of emergency dept and after hours care, and addressed their concerns.    Health screening: See your eye doctor yearly for routine vision care. See your dentist yearly for routine dental care including hygiene visits twice yearly.   Cancer screening: Colonoscopy October/20 11/2017, Dr. Jackquline Denmark, showed mild pancolonic diverticulosis  predominantly in the sigmoid colon otherwise normal. Repeat 10 years  Discussed PSA, prostate exam, and prostate cancer screening risks/benefits.     Discussed regular skin surveillance   Vaccinations: Advised yearly influenza vaccine Tdap Aug 2017, up-to-date He notes having covid vaccine.  Will get [place] copy of card.  Recommended shingles vaccine  Counseled on the influenza virus vaccine.  Vaccine information sheet given.  Influenza vaccine given after consent obtained.     Separate significant chronic issues discussed: Hypertension-continue amlodipine 5 mg daily, carvedilol 6.25 mg twice daily, losartan 50 mg daily  Hyperlipidemia, aortic atherosclerosis -continue Crestor 40 mg daily.  Counseled on low-cholesterol diet  GERD-continue omeprazole as needed but discussed risk of medicine long-term  Impaired fasting glucose, prediabetes -labs today  BPH-PSA today  Neuropathy of legs-I reviewed neurological notes from Dr. Brett Fairy from last year.  He had nerve conduction studies but I do not have copies of these.  Given his borderline diabetes levels in the past, neurology felt like this could be the cause of his neuropathy symptoms  Elevated uric acid-update labs today  Pains in the hands and wrist-uric acid and sed rate today.  Likely osteoarthritis.  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  No obvious abnormality on exam today though.  Consider baseline x-rays  Snoring, sleep disturbance-discussed possible causes, recommend a sleep study.  He will consider   [place] was seen today for annual exam.  Diagnoses and all orders for this visit:  Routine general medical examination at a health care facility -     Comprehensive metabolic panel -     CBC with Differential/Platelet -     PSA -     Hemoglobin A1c -     Uric acid -     Lipid panel -     Sedimentation rate -     Cancel: POCT Urinalysis DIP (Proadvantage Device)  Benign prostatic hyperplasia without lower urinary tract  symptoms -     PSA  Gastroesophageal reflux disease without esophagitis  Impaired fasting blood sugar -     Hemoglobin A1c  Essential hypertension  Aortic atherosclerosis ([facility])  Snoring  Erectile dysfunction, unspecified erectile dysfunction type  Dyslipidemia -     Lipid panel  Vaccine counseling  Macular degeneration, unspecified laterality, unspecified type  Screening for prostate cancer -     Hemoglobin A1c  Elevated uric acid in blood -     Uric acid  Lipoma of torso  Need for influenza vaccination  Polyarthralgia -     Sedimentation rate  Arthralgia, unspecified joint -     Sedimentation rate  Sleep disturbance  Other orders -  Flu Vaccine QUAD 6+ mos PF IM (Fluarix Quad PF)    Follow-up pending labs, yearly for physical

## 2020-05-14 ENCOUNTER — Other Ambulatory Visit: Payer: Self-pay | Admitting: Medical

## 2020-05-14 LAB — CBC WITH DIFFERENTIAL/PLATELET
Basophils Absolute: 0 10*3/uL (ref 0.0–0.2)
Basos: 0 %
EOS (ABSOLUTE): 0.1 10*3/uL (ref 0.0–0.4)
Eos: 2 %
Hematocrit: 42.5 % (ref 37.5–51.0)
Hemoglobin: 14.2 g/dL (ref 13.0–17.7)
Immature Grans (Abs): 0 10*3/uL (ref 0.0–0.1)
Immature Granulocytes: 0 %
Lymphocytes Absolute: 1.5 10*3/uL (ref 0.7–3.1)
Lymphs: 31 %
MCH: 31.3 pg (ref 26.6–33.0)
MCHC: 33.4 g/dL (ref 31.5–35.7)
MCV: 94 fL (ref 79–97)
Monocytes Absolute: 0.5 10*3/uL (ref 0.1–0.9)
Monocytes: 10 %
Neutrophils Absolute: 2.9 10*3/uL (ref 1.4–7.0)
Neutrophils: 57 %
Platelets: 224 10*3/uL (ref 150–450)
RBC: 4.54 x10E6/uL (ref 4.14–5.80)
RDW: 13.2 % (ref 11.6–15.4)
WBC: 5 10*3/uL (ref 3.4–10.8)

## 2020-05-14 LAB — COMPREHENSIVE METABOLIC PANEL
ALT: 25 IU/L (ref 0–44)
AST: 22 IU/L (ref 0–40)
Albumin/Globulin Ratio: 1.5 (ref 1.2–2.2)
Albumin: 4.4 g/dL (ref 3.8–4.8)
Alkaline Phosphatase: 91 IU/L (ref 48–121)
BUN/Creatinine Ratio: 12 (ref 10–24)
BUN: 17 mg/dL (ref 8–27)
Bilirubin Total: 0.3 mg/dL (ref 0.0–1.2)
CO2: 20 mmol/L (ref 20–29)
Calcium: 9.4 mg/dL (ref 8.6–10.2)
Chloride: 107 mmol/L — ABNORMAL HIGH (ref 96–106)
Creatinine, Ser: 1.38 mg/dL — ABNORMAL HIGH (ref 0.76–1.27)
GFR calc Af Amer: 62 mL/min/{1.73_m2} (ref >59)
GFR calc non Af Amer: 54 mL/min/{1.73_m2} — ABNORMAL LOW (ref >59)
Globulin, Total: 2.9 g/dL (ref 1.5–4.5)
Glucose: 107 mg/dL — ABNORMAL HIGH (ref 65–99)
Potassium: 4.1 mmol/L (ref 3.5–5.2)
Sodium: 142 mmol/L (ref 134–144)
Total Protein: 7.3 g/dL (ref 6.0–8.5)

## 2020-05-14 LAB — LIPID PANEL
Chol/HDL Ratio: 4.1 ratio (ref 0.0–5.0)
Cholesterol, Total: 161 mg/dL (ref 100–199)
HDL: 39 mg/dL — ABNORMAL LOW (ref >39)
LDL Chol Calc (NIH): 102 mg/dL — ABNORMAL HIGH (ref 0–99)
Triglycerides: 109 mg/dL (ref 0–149)
VLDL Cholesterol Cal: 20 mg/dL (ref 5–40)

## 2020-05-14 LAB — HEMOGLOBIN A1C
Est. average glucose Bld gHb Est-mCnc: 134 mg/dL
Hgb A1c MFr Bld: 6.3 % — ABNORMAL HIGH (ref 4.8–5.6)

## 2020-05-14 LAB — PSA: Prostate Specific Ag, Serum: 0.6 ng/mL (ref 0.0–4.0)

## 2020-05-14 LAB — URIC ACID: Uric Acid: 6.1 mg/dL (ref 3.8–8.4)

## 2020-05-14 LAB — SEDIMENTATION RATE: Sed Rate: 29 mm/hr (ref 0–30)

## 2020-05-14 MED ORDER — LOSARTAN POTASSIUM 50 MG PO TABS: 50.0000 mg | ORAL_TABLET | Freq: Every day | ORAL | 3 refills | Status: DC

## 2020-05-14 MED ORDER — CARVEDILOL 6.25 MG PO TABS: 6.2500 mg | ORAL_TABLET | Freq: Two times a day (BID) | ORAL | 3 refills | Status: DC

## 2020-05-14 MED ORDER — AMLODIPINE BESYLATE 5 MG PO TABS: 5.0000 mg | ORAL_TABLET | Freq: Every day | ORAL | 3 refills | Status: DC

## 2020-05-17 ENCOUNTER — Other Ambulatory Visit: Payer: Self-pay | Admitting: Medical

## 2020-05-17 MED ORDER — GABAPENTIN 100 MG PO CAPS: ORAL_CAPSULE | ORAL | 3 refills | Status: DC

## 2020-05-17 MED ORDER — DAPAGLIFLOZIN PROPANEDIOL 5 MG PO TABS: 5.0000 mg | ORAL_TABLET | Freq: Every day | ORAL | 1 refills | Status: DC

## 2020-05-25 ENCOUNTER — Other Ambulatory Visit: Payer: Self-pay | Admitting: Medical

## 2020-05-25 MED ORDER — DAPAGLIFLOZIN PROPANEDIOL 5 MG PO TABS: 5.0000 mg | ORAL_TABLET | Freq: Every day | ORAL | 1 refills | Status: DC

## 2020-07-24 ENCOUNTER — Other Ambulatory Visit: Payer: Self-pay | Admitting: Medical

## 2020-10-15 ENCOUNTER — Other Ambulatory Visit: Payer: Self-pay | Admitting: Medical

## 2020-12-10 ENCOUNTER — Other Ambulatory Visit: Payer: Self-pay | Admitting: Medical

## 2020-12-13 NOTE — Telephone Encounter (Signed)
We added some medication last visit. Get back in for fasting f/u.    I refilled gabapentin requested x 30 days

## 2021-01-12 ENCOUNTER — Other Ambulatory Visit: Payer: Self-pay | Admitting: Medical

## 2021-02-15 ENCOUNTER — Other Ambulatory Visit: Payer: Self-pay | Admitting: Medical

## 2021-03-17 ENCOUNTER — Encounter: Payer: Self-pay | Admitting: Medical

## 2021-03-17 ENCOUNTER — Other Ambulatory Visit: Payer: Self-pay

## 2021-03-17 ENCOUNTER — Telehealth: Payer: 59 | Admitting: Medical

## 2021-03-17 ENCOUNTER — Ambulatory Visit
Admission: RE | Admit: 2021-03-17 | Discharge: 2021-03-17 | Disposition: A | Discharge status: Home / Self Care | Payer: 59 | Source: Ambulatory Visit | Attending: Medical | Admitting: Medical

## 2021-03-17 ENCOUNTER — Other Ambulatory Visit: Payer: Self-pay | Admitting: Medical

## 2021-03-17 VITALS — BP 152/96 | HR 82 | Temp 96.5°F | Wt 242.0 lb

## 2021-03-17 DIAGNOSIS — I700 Atherosclerosis of aorta: Secondary | ICD-10-CM

## 2021-03-17 DIAGNOSIS — R0600 Dyspnea, unspecified: Secondary | ICD-10-CM | POA: Insufficient documentation

## 2021-03-17 DIAGNOSIS — Z87891 Personal history of nicotine dependence: Secondary | ICD-10-CM

## 2021-03-17 DIAGNOSIS — R7301 Impaired fasting glucose: Secondary | ICD-10-CM

## 2021-03-17 DIAGNOSIS — R5383 Other fatigue: Secondary | ICD-10-CM

## 2021-03-17 DIAGNOSIS — I10 Essential (primary) hypertension: Secondary | ICD-10-CM

## 2021-03-17 DIAGNOSIS — E785 Hyperlipidemia, unspecified: Secondary | ICD-10-CM

## 2021-03-17 DIAGNOSIS — R0683 Snoring: Secondary | ICD-10-CM

## 2021-03-17 DIAGNOSIS — R0602 Shortness of breath: Secondary | ICD-10-CM

## 2021-03-17 DIAGNOSIS — R400 Somnolence: Secondary | ICD-10-CM

## 2021-03-17 DIAGNOSIS — R0789 Other chest pain: Secondary | ICD-10-CM | POA: Diagnosis not present

## 2021-03-17 DIAGNOSIS — R0609 Other forms of dyspnea: Secondary | ICD-10-CM | POA: Insufficient documentation

## 2021-03-17 NOTE — Patient Instructions (Signed)
Please go to [facility] for your chest xray.   Their hours are 8am - 4:30 pm Monday - Friday.  Take your insurance card with you. ° °[facility] °[phone] ° °[address], [address] °[place], [place] [address] ° °[address] °[place], [place] [address] ° ° °

## 2021-03-17 NOTE — Progress Notes (Addendum)
Subjective:     Patient ID: Dave Carpenter, male   DOB: 23-Mar-1956, 65 y.o.   MRN: [id number]  HPI Chief Complaint  Patient presents with   Cough   Headache   Shortness of Breath   Fatigue   Nasal Congestion    Started about a month ago   He noted some mild SOB about a month ago.   Would see mild sob with conversations.  Has felt sluggish, some body aches, joint aches, elbow, wrist, others.   Every now and then gets sneezing, and if gets overheated, has some cough spells . Getting headaches a few times per week.  Every now and then having some chest discomfort.  Its brief for a few breaths.   No associated SOB, nausea, sweats, jaw or arm pain.   This can be with rest or activity.  No different with activity or not.  He notes compliance with his medications.  Denies swelling.  No recent weight change.  No other aggravating or relieving factors. No other complaint.  Past Medical History:  Diagnosis Date   Allergy    Aortic atherosclerosis ([place]) 2017   Arthritis    knees, shouders, wrists   Birth mark    upper abdomen   Elevated uric acid in blood 2018   Erectile dysfunction    GERD (gastroesophageal reflux disease)    H/O cardiovascular stress test 06/2009   normal nuclear stress test, Dr. Quay Burow   H/O echocardiogram 05/2016   normal LV systolic function, EF 25-36%, grade 1 diastolic dysfunction   History of seizure    few times as a child, none as an adult   Hyperlipidemia    Hypertension    Impaired fasting blood sugar 2018   Lipoma of back    since 2007 or earlier   Macular degeneration    Snoring 2018   sleep study declined due to cost   Wears glasses    Current Outpatient Medications on File Prior to Visit  Medication Sig Dispense Refill   amLODipine (NORVASC) 5 MG tablet Take 1 tablet (5 mg total) by mouth daily. 90 tablet 3   carvedilol (COREG) 6.25 MG tablet Take 1 tablet (6.25 mg total) by mouth 2 (two) times daily with a meal. Reported on 01/06/2016 180  tablet 3   gabapentin (NEURONTIN) 100 MG capsule TAKE 1 CAPSULE IN THE MORNING, 3 CAPULES AT BEDTIME 120 capsule 0   losartan (COZAAR) 50 MG tablet Take 1 tablet (50 mg total) by mouth daily. 90 tablet 3   rosuvastatin (CRESTOR) 40 MG tablet TAKE 1 TABLET BY MOUTH EVERY DAY 90 tablet 0   dapagliflozin propanediol (FARXIGA) 5 MG TABS tablet Take 1 tablet (5 mg total) by mouth daily before breakfast. (Patient not taking: Reported on 03/17/2021) 90 tablet 1   omeprazole (PRILOSEC) 40 MG capsule TAKE 1 CAPSULE BY MOUTH EVERY DAY (Patient not taking: Reported on 03/17/2021) 90 capsule 0   No current facility-administered medications on file prior to visit.    Review of Systems As in subjective    Objective:   Physical Exam Due to coronavirus pandemic stay at home measures, patient visit was virtual and they were not examined in person.   BP (!) 152/96   Pulse 82   Temp (!) 96.5 F (35.8 C)   Wt 242 lb (109.8 kg)   SpO2 98%   BMI 33.75 kg/m   Wt Readings from Last 3 Encounters:  03/17/21 242 lb (109.8 kg)  05/13/20 242  lb 3.2 oz (109.9 kg)  05/13/19 238 lb 9.6 oz (108.2 kg)    General appearence: alert, no distress, WD/WN, African American male HEENT: normocephalic, sclerae anicteric, PERRLA, EOMi, nares patent, no discharge or erythema, pharynx normal Oral cavity: somewhat dry MM, no lesions Neck: supple, no lymphadenopathy, no thyromegaly, no masses Heart: RRR, normal S1, S2, no murmurs Lungs: CTA bilaterally, no wheezes, rhonchi, or rales Extremities: no edema, no cyanosis, no clubbing Pulses: 2+ symmetric, upper and lower extremities, normal cap refill Neurological: alert, oriented x 3, CN2-12 intact, strength normal upper extremities and lower extremities, sensation normal throughout, DTRs 2+ throughout, no cerebellar signs, gait normal Psychiatric: normal affect, behavior normal, pleasant      Adult ECG Report  Indication: sob  Rate: 74 bpm  Rhythm: normal sinus rhythm   QRS Axis: -14 degrees  PR Interval: 139ms  QRS Duration: 80ms  QTc: 428ms  Conduction Disturbances: none  Other Abnormalities: none  Patient's cardiac risk factors are: dyslipidemia, hypertension, and obesity (BMI >= 30 kg/m2).  EKG comparison: none  Narrative Interpretation: no acute change      Assessment:     Encounter Diagnoses  Name Primary?   Fatigue, unspecified type Yes   SOB (shortness of breath)    Chest discomfort    Impaired fasting blood sugar    Essential hypertension    Former smoker    Dyslipidemia    Aortic atherosclerosis ([facility])    Snoring    Daytime somnolence        Plan:     Discussed symptoms and concerns.  I advised to go for chest x-ray.  EKG,  Labs today.  We discussed the need for better hydration.  Continue current medications.  Blood pressure not at goal so we will need to consider modifications to regimen.  In the past year sleep study was cost prohibitive.  We will look into finding other options to get a sleep study  If new or worsening symptoms in the meantime such as acute chest pain or shortness of breath then call [place] was seen today for cough, headache, shortness of breath, fatigue and nasal congestion.  Diagnoses and all orders for this visit:  Fatigue, unspecified type -     Comprehensive metabolic panel -     CBC -     TSH -     EKG 12-Lead -     Pulse oximetry (single); Future -     DG Chest 2 View; Future  SOB (shortness of breath) -     Comprehensive metabolic panel -     CBC -     TSH -     EKG 12-Lead -     Pulse oximetry (single); Future -     DG Chest 2 View; Future  Chest discomfort -     Comprehensive metabolic panel -     CBC -     TSH -     EKG 12-Lead -     Pulse oximetry (single); Future -     DG Chest 2 View; Future  Impaired fasting blood sugar -     Comprehensive metabolic panel -     CBC -     TSH -     EKG 12-Lead -     Pulse oximetry (single); Future -     DG Chest 2 View;  Future  Essential hypertension -     Comprehensive metabolic panel -     CBC -  TSH -     EKG 12-Lead -     Pulse oximetry (single); Future -     DG Chest 2 View; Future  Former smoker -     Comprehensive metabolic panel -     CBC -     TSH -     EKG 12-Lead -     Pulse oximetry (single); Future -     DG Chest 2 View; Future  Dyslipidemia -     Comprehensive metabolic panel -     CBC -     TSH -     EKG 12-Lead -     Pulse oximetry (single); Future -     DG Chest 2 View; Future  Aortic atherosclerosis (HCC) -     Comprehensive metabolic panel -     CBC -     TSH -     EKG 12-Lead -     Pulse oximetry (single); Future -     DG Chest 2 View; Future  Snoring  Daytime somnolence  Pending labs, CXR

## 2021-03-18 LAB — CBC
Hematocrit: 42 % (ref 37.5–51.0)
Hemoglobin: 14.2 g/dL (ref 13.0–17.7)
MCH: 31.3 pg (ref 26.6–33.0)
MCHC: 33.8 g/dL (ref 31.5–35.7)
MCV: 93 fL (ref 79–97)
Platelets: 245 10*3/uL (ref 150–450)
RBC: 4.54 x10E6/uL (ref 4.14–5.80)
RDW: 13.5 % (ref 11.6–15.4)
WBC: 5 10*3/uL (ref 3.4–10.8)

## 2021-03-18 LAB — COMPREHENSIVE METABOLIC PANEL
ALT: 19 IU/L (ref 0–44)
AST: 20 IU/L (ref 0–40)
Albumin/Globulin Ratio: 1.4 (ref 1.2–2.2)
Albumin: 4.2 g/dL (ref 3.8–4.8)
Alkaline Phosphatase: 83 IU/L (ref 44–121)
BUN/Creatinine Ratio: 12 (ref 10–24)
BUN: 15 mg/dL (ref 8–27)
Bilirubin Total: 0.4 mg/dL (ref 0.0–1.2)
CO2: 20 mmol/L (ref 20–29)
Calcium: 9.3 mg/dL (ref 8.6–10.2)
Chloride: 104 mmol/L (ref 96–106)
Creatinine, Ser: 1.28 mg/dL — ABNORMAL HIGH (ref 0.76–1.27)
Globulin, Total: 3 g/dL (ref 1.5–4.5)
Glucose: 105 mg/dL — ABNORMAL HIGH (ref 65–99)
Potassium: 4 mmol/L (ref 3.5–5.2)
Sodium: 142 mmol/L (ref 134–144)
Total Protein: 7.2 g/dL (ref 6.0–8.5)
eGFR: 62 mL/min/{1.73_m2} (ref >59)

## 2021-03-18 LAB — TSH: TSH: 1.57 u[IU]/mL (ref 0.450–4.500)

## 2021-03-21 ENCOUNTER — Other Ambulatory Visit: Payer: Self-pay

## 2021-03-21 DIAGNOSIS — R0602 Shortness of breath: Secondary | ICD-10-CM

## 2021-04-12 ENCOUNTER — Other Ambulatory Visit: Payer: Self-pay | Admitting: Medical

## 2021-04-14 ENCOUNTER — Other Ambulatory Visit: Payer: Self-pay | Admitting: Medical

## 2021-04-14 NOTE — Telephone Encounter (Signed)
Is this okay to refill? 

## 2021-05-05 ENCOUNTER — Other Ambulatory Visit: Payer: Self-pay | Admitting: Medical

## 2021-05-05 NOTE — Telephone Encounter (Signed)
Has an appt coming up

## 2021-05-16 ENCOUNTER — Ambulatory Visit (INDEPENDENT_AMBULATORY_CARE_PROVIDER_SITE_OTHER): Payer: 59 | Admitting: Medical

## 2021-05-16 ENCOUNTER — Other Ambulatory Visit: Payer: Self-pay

## 2021-05-16 VITALS — BP 130/84 | HR 87 | Ht 72.0 in | Wt 249.2 lb

## 2021-05-16 DIAGNOSIS — R208 Other disturbances of skin sensation: Secondary | ICD-10-CM

## 2021-05-16 DIAGNOSIS — Z7185 Encounter for immunization safety counseling: Secondary | ICD-10-CM | POA: Diagnosis not present

## 2021-05-16 DIAGNOSIS — N400 Enlarged prostate without lower urinary tract symptoms: Secondary | ICD-10-CM

## 2021-05-16 DIAGNOSIS — G629 Polyneuropathy, unspecified: Secondary | ICD-10-CM

## 2021-05-16 DIAGNOSIS — N529 Male erectile dysfunction, unspecified: Secondary | ICD-10-CM

## 2021-05-16 DIAGNOSIS — M2550 Pain in unspecified joint: Secondary | ICD-10-CM | POA: Diagnosis not present

## 2021-05-16 DIAGNOSIS — G479 Sleep disorder, unspecified: Secondary | ICD-10-CM

## 2021-05-16 DIAGNOSIS — Z125 Encounter for screening for malignant neoplasm of prostate: Secondary | ICD-10-CM

## 2021-05-16 DIAGNOSIS — D171 Benign lipomatous neoplasm of skin and subcutaneous tissue of trunk: Secondary | ICD-10-CM

## 2021-05-16 DIAGNOSIS — Z87891 Personal history of nicotine dependence: Secondary | ICD-10-CM

## 2021-05-16 DIAGNOSIS — Z23 Encounter for immunization: Secondary | ICD-10-CM

## 2021-05-16 DIAGNOSIS — I700 Atherosclerosis of aorta: Secondary | ICD-10-CM

## 2021-05-16 DIAGNOSIS — R0683 Snoring: Secondary | ICD-10-CM

## 2021-05-16 DIAGNOSIS — Z0000 Encounter for general adult medical examination without abnormal findings: Secondary | ICD-10-CM

## 2021-05-16 DIAGNOSIS — N182 Chronic kidney disease, stage 2 (mild): Secondary | ICD-10-CM

## 2021-05-16 DIAGNOSIS — H3530 Unspecified macular degeneration: Secondary | ICD-10-CM

## 2021-05-16 DIAGNOSIS — K219 Gastro-esophageal reflux disease without esophagitis: Secondary | ICD-10-CM

## 2021-05-16 DIAGNOSIS — R5383 Other fatigue: Secondary | ICD-10-CM

## 2021-05-16 DIAGNOSIS — E669 Obesity, unspecified: Secondary | ICD-10-CM

## 2021-05-16 DIAGNOSIS — R7301 Impaired fasting glucose: Secondary | ICD-10-CM

## 2021-05-16 DIAGNOSIS — R400 Somnolence: Secondary | ICD-10-CM

## 2021-05-16 DIAGNOSIS — E785 Hyperlipidemia, unspecified: Secondary | ICD-10-CM

## 2021-05-16 DIAGNOSIS — I10 Essential (primary) hypertension: Secondary | ICD-10-CM

## 2021-05-16 DIAGNOSIS — Z6832 Body mass index (BMI) 32.0-32.9, adult: Secondary | ICD-10-CM

## 2021-05-16 NOTE — Progress Notes (Signed)
Subjective:    HPI  Dave Carpenter is a 65 y.o. male who presents for Chief Complaint  Patient presents with   cpe    Fasting cpe. Tired all the time    Patient Care Team: Varvara Legault, Leward Quan as PCP - General (Family Medicine) Sees dentist Sees eye doctor Dr. Larey Seat, neurology Dr. Jackquline Denmark, GI Dr. Daneen Schick, cardiology Dr. Joni Fears, ortho   Concerns: Hypertension-compliant with medications.  No chest pain, no edema, no palpitations  Hyperlipidemia-compliant with rosuvastatin without complaint  He has ongoing issues with pains in his joints, her  He notes stabbing pains down left wrist and right hand from time to time. Pain can be both sides of hand and wrist.   If he goes to pick up a glass, can't squeeze the glass due ot pain.   Ten a little later can pick up in a different hand position but it hurts.  Pains typically stay in hands and lower forearms. At times feels stiff in hands.  Knees hurt from time to time.  No joint swelling.   Feels stiff every morning for a few minutes.    HTN - compliant with Amlodipine '5mg'$  daily, Carvedilol 6.'25mg'$  BID, losartan '50mg'$  daily  Hyperlipidemia - compliant with Crestor '40mg'$  daily  Neuropathy - taking gabapentin daily at bedtime, '300mg'$ .  Legs feel ok of late.  Still has fair amount of numbness sensation in right hip to lower leg.   GERD - uses omeprazole daily.   Reviewed their medical, surgical, family, social, medication, and allergy history and updated chart as appropriate.  Past Medical History:  Diagnosis Date   Allergy    Aortic atherosclerosis ([place]) 2017   Arthritis    knees, shouders, wrists   Birth mark    upper abdomen   Elevated uric acid in blood 2018   Erectile dysfunction    GERD (gastroesophageal reflux disease)    H/O cardiovascular stress test 06/2009   normal nuclear stress test, Dr. Quay Burow   H/O echocardiogram 05/2016   normal LV systolic function, EF 0000000, grade 1  diastolic dysfunction   History of seizure    few times as a child, none as an adult   Hyperlipidemia    Hypertension    Impaired fasting blood sugar 2018   Lipoma of back    since 2007 or earlier   Macular degeneration    Snoring 2018   sleep study declined due to cost   Wears glasses     Past Surgical History:  Procedure Laterality Date   COLONOSCOPY  06/2018   Mild pancolonic diverticulosis of the sigmoid colon, Dr. Jackquline Denmark;  2008 with Dr. Benson [place]   ganglion cyst removal     left wrist   right lung surgery to remove mass  2007   benign   TONSILLECTOMY      Family History  Problem Relation Age of Onset   Diabetes Mother    Stroke Mother    Other Mother        brain tumor   Stroke Father    Diabetes Father    Heart disease Sister    Heart disease Brother    Cancer Neg Hx      Current Outpatient Medications:    amLODipine (NORVASC) 5 MG tablet, Take 1 tablet (5 mg total) by mouth daily., Disp: 90 tablet, Rfl: 3   carvedilol (COREG) 6.25 MG tablet, TAKE 1 TABLET (6.25 MG TOTAL) BY MOUTH 2 (TWO) TIMES DAILY  WITH A MEAL. REPORTED ON 01/06/2016, Disp: 30 tablet, Rfl: 0   losartan (COZAAR) 50 MG tablet, TAKE 1 TABLET BY MOUTH EVERY DAY, Disp: 30 tablet, Rfl: 0   omeprazole (PRILOSEC) 40 MG capsule, TAKE 1 CAPSULE BY MOUTH EVERY DAY, Disp: 90 capsule, Rfl: 0   rosuvastatin (CRESTOR) 40 MG tablet, TAKE 1 TABLET BY MOUTH EVERY DAY, Disp: 90 tablet, Rfl: 0   albuterol (VENTOLIN HFA) 108 (90 Base) MCG/ACT inhaler, Inhale 2 puffs into the lungs every 6 (six) hours as needed., Disp: , Rfl:    dapagliflozin propanediol (FARXIGA) 5 MG TABS tablet, Take 1 tablet (5 mg total) by mouth daily before breakfast. (Patient not taking: Reported on 05/16/2021), Disp: 90 tablet, Rfl: 1   gabapentin (NEURONTIN) 100 MG capsule, TAKE 1 CAPSULE IN THE MORNING, 3 CAPULES AT BEDTIME (Patient not taking: Reported on 05/16/2021), Disp: 120 capsule, Rfl: 0  Allergies  Allergen Reactions   Aspirin      Upset stomach, gets hives     Review of Systems Constitutional: -fever, -chills, -sweats, -unexpected weight change, -decreased appetite, -fatigue Allergy: -sneezing, -itching, -congestion Dermatology: -changing moles, --rash, -lumps ENT: -runny nose, -ear pain, -sore throat, -hoarseness, -sinus pain, -teeth pain, - ringing in ears, -hearing loss, -nosebleeds Cardiology: -chest pain, -palpitations, -swelling, -difficulty breathing when lying flat, -waking up short of breath Respiratory: -cough, -shortness of breath, -difficulty breathing with exercise or exertion, -wheezing, -coughing up blood Gastroenterology: -abdominal pain, -nausea, -vomiting, -diarrhea, -constipation, -blood in stool, -changes in bowel movement, -difficulty swallowing or eating Hematology: -bleeding, -bruising  Musculoskeletal:  +joint aches, -muscle aches, -joint swelling, -back pain, -neck pain, -cramping, -changes in gait Ophthalmology: denies vision changes, eye redness, itching, discharge Urology: -burning with urination, -difficulty urinating, -blood in urine, -urinary frequency, -urgency, -incontinence Neurology: -headache, -weakness, -tingling, +numbness, -memory loss, -falls, -dizziness Psychology: -depressed mood, -agitation, -sleep problems Male GU: no testicular mass, pain, no lymph nodes swollen, no swelling, no rash.       Objective:  BP 130/84   Pulse 87   Ht 6' (1.829 m)   Wt 249 lb 3.2 oz (113 kg)   BMI 33.80 kg/m    Wt Readings from Last 3 Encounters:  05/16/21 249 lb 3.2 oz (113 kg)  03/17/21 242 lb (109.8 kg)  05/13/20 242 lb 3.2 oz (109.9 kg)    General appearance: alert, no distress, WD/WN, African American male Skin: unremarkable, right upper abdomen with brown flat birth mark, approx 8cm x 5cm, scattered macules, large spongy lipoma right mid to low back approx 10-11 cm diam, unchanged, no other worrisome lesions HEENT: normocephalic, conjunctiva/corneas normal, sclerae anicteric,  PERRLA, EOMi Neck: supple, no lymphadenopathy, no thyromegaly, no masses, normal ROM, no bruits Chest: non tender, normal shape and expansion Heart: RRR, normal S1, S2, no murmurs Lungs: CTA bilaterally, no wheezes, rhonchi, or rales Abdomen: +bs, soft, non tender, non distended, no masses, no hepatomegaly, no splenomegaly, no bruits Back: non tender, normal ROM, no scoliosis Musculoskeletal: Hands and wrists with no obvious deformity, nontender to palpation, normal range of motion, no swelling, otherwise upper extremities non tender, no obvious deformity, normal ROM throughout, lower extremities non tender, no obvious deformity, normal ROM throughout Extremities: no edema, no cyanosis, no clubbing Pulses: 2+ symmetric, upper and lower extremities, normal cap refill Neurological: Normal wrist and hand and finger strength and sensation, alert, oriented x 3, CN2-12 intact, strength normal upper extremities and lower extremities, sensation normal throughout, DTRs 2+ throughout, no cerebellar signs, gait normal Psychiatric: normal affect,  behavior normal, pleasant  GU: normal GU, circ, no mass, no hernia, no lymphadenopathy Rectal: prostate mildly enlarged, no nodules    Assessment and Plan :   Encounter Diagnoses  Name Primary?   Routine general medical examination at a health care facility Yes   Polyarthralgia    Screening for prostate cancer    Vaccine counseling    Neuropathy    Class 1 obesity with serious comorbidity and body mass index (BMI) of 32.0 to 32.9 in adult, unspecified obesity type    Macular degeneration, unspecified laterality, unspecified type    Impaired fasting blood sugar    Lipoma of torso    Gastroesophageal reflux disease without esophagitis    Former smoker    Erectile dysfunction, unspecified erectile dysfunction type    Dyslipidemia    Benign prostatic hyperplasia without lower urinary tract symptoms    Aortic atherosclerosis (HCC)    Needs flu shot     Essential hypertension    Fatigue, unspecified type    Snoring    Sleep disturbance    Burning sensation of feet    Daytime somnolence     Physical exam - discussed and counseled on healthy lifestyle, diet, exercise, preventative care, vaccinations, sick and well care, proper use of emergency dept and after hours care, and addressed their concerns.    Health screening: See your eye doctor yearly for routine vision care. See your dentist yearly for routine dental care including hygiene visits twice yearly.   Cancer screening: Colonoscopy October/20 11/2017, Dr. Jackquline Denmark, showed mild pan colonic diverticulosis predominantly in the sigmoid colon otherwise normal. Repeat 10 years  Discussed PSA, prostate exam, and prostate cancer screening risks/benefits.   Updated PSA lab today.  Prostate exam shows mild enlargement.  I recommend regular skin surveillance to watch for changing moles or new skin lesions   Vaccinations: Tdap Aug 2017, up-to-date  Shingles vaccine:  I recommend you have a shingles vaccine to help prevent shingles or herpes zoster outbreak.   Please call your insurer to inquire about coverage for the Shingrix vaccine given in 2 doses.   Some insurers cover this vaccine after age 55, some cover this after age 64.  If your insurer covers this, then call to schedule appointment to have this vaccine here.  Counseled on the influenza virus vaccine.  Vaccine information sheet given.  Influenza vaccine given after consent obtained.  I recommend the pneumococcal vaccine as well.     Separate significant chronic issues discussed:  Hypertension-continue amlodipine 5 mg daily, carvedilol 6.25 mg twice daily, losartan 50 mg daily  Hyperlipidemia, aortic atherosclerosis -continue rosuvastatin/Crestor 40 mg daily.  Eat a low-cholesterol diet  GERD-continue omeprazole as needed.  Long-term use of omeprazole can lower your bone density as you get older and can change his  absorption of certain nutrients.  Impaired fasting glucose, prediabetes -labs today.  I recommend you eat a healthy low sugar diet, exercise regularly, I recommend that we try to see if insurance will cover a medicine similar to the [place] I had discussed prior.  A medicine such as Lovie Macadamia or Steglatro are used for blood sugar control, but they can help with weight loss and some of these medicines have been shown to lower risk of heart disease and kidney disease.  Enlarged prostate-routine PSA lab today  Fatigue-continue plan for sleep study this week  CKD 2-chronic kidney disease stage II mild. I strongly recommend hydrating with 80 to 100 ounces of water daily Due to  abnormal kidney function, and in order to protect your kidneys, I recommend you avoid medications that can harm the kidneys such as ibuprofen, Aleve, Advil, Motrin, Naprosyn, or prescription anti-inflammatories which are used for pain, inflammation, and arthritis.   You should avoid dehydration which can harm the kidneys. lets continue to work to keep blood sugar and blood pressures under control  Neuropathy of legs-I reviewed neurological notes from Dr. Brett Fairy from last year.  You have had prior nerve conduction studies.  Given your borderline diabetes levels in the past, neurology felt like this could be the cause of his neuropathy symptoms.  We can increase gabapentin/Neurontin medication that you had tried before if it was not helping.  Pains in the hands and wrist- prior normal uric acid and sed rate labs.  Likely osteoarthritis.  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    Snoring, sleep disturbance - discussed possible causes, he has sleep study appointment this week  Elevated uric acid-update labs recently were normal  Dave Carpenter was seen today for cpe.  Diagnoses and all orders for this visit:  Routine general medical examination at a health care facility -     PSA -     Lipid panel -     Hemoglobin  A1c  Polyarthralgia  Screening for prostate cancer -     PSA  Vaccine counseling  Neuropathy  Class 1 obesity with serious comorbidity and body mass index (BMI) of 32.0 to 32.9 in adult, unspecified obesity type  Macular degeneration, unspecified laterality, unspecified type  Impaired fasting blood sugar -     Hemoglobin A1c  Lipoma of torso  Gastroesophageal reflux disease without esophagitis  Former smoker  Erectile dysfunction, unspecified erectile dysfunction type  Dyslipidemia -     Lipid panel  Benign prostatic hyperplasia without lower urinary tract symptoms  Aortic atherosclerosis (HCC)  Needs flu shot -     Flu Vaccine QUAD 55moIM (Fluarix, Fluzone & Alfiuria Quad PF)  Essential hypertension  Fatigue, unspecified type  Snoring  Sleep disturbance  Burning sensation of feet  Daytime somnolence   Follow-up pending labs, yearly for physical

## 2021-05-16 NOTE — Patient Instructions (Signed)
  Physical exam - discussed and counseled on healthy lifestyle, diet, exercise, preventative care, vaccinations, sick and well care, proper use of emergency dept and after hours care, and addressed their concerns.    Health screening: See your eye doctor yearly for routine vision care. See your dentist yearly for routine dental care including hygiene visits twice yearly.   Cancer screening: Colonoscopy October/20 11/2017, Dr. Jackquline Denmark, showed mild pan colonic diverticulosis predominantly in the sigmoid colon otherwise normal. Repeat 10 years  Discussed PSA, prostate exam, and prostate cancer screening risks/benefits.   Updated PSA lab today.  Prostate exam shows mild enlargement.  I recommend regular skin surveillance to watch for changing moles or new skin lesions   Vaccinations: Tdap Aug 2017, up-to-date  Shingles vaccine:  I recommend you have a shingles vaccine to help prevent shingles or herpes zoster outbreak.   Please call your insurer to inquire about coverage for the Shingrix vaccine given in 2 doses.   Some insurers cover this vaccine after age 27, some cover this after age 52.  If your insurer covers this, then call to schedule appointment to have this vaccine here.  Counseled on the influenza virus vaccine.  Vaccine information sheet given.  Influenza vaccine given after consent obtained.  I recommend the pneumococcal vaccine as well.     Separate significant chronic issues discussed:  Hypertension-continue amlodipine 5 mg daily, carvedilol 6.25 mg twice daily, losartan 50 mg daily  Hyperlipidemia, aortic atherosclerosis -continue rosuvastatin/Crestor 40 mg daily.  Eat a low-cholesterol diet  GERD-continue omeprazole as needed.  Long-term use of omeprazole can lower your bone density as you get older and can change his absorption of certain nutrients.  Impaired fasting glucose, prediabetes -labs today.  I recommend you eat a healthy low sugar diet, exercise regularly,  I recommend that we try to see if insurance will cover a medicine similar to the [place] I had discussed prior.  A medicine such as Lovie Macadamia or Steglatro are used for blood sugar control, but they can help with weight loss and some of these medicines have been shown to lower risk of heart disease and kidney disease.  Enlarged prostate-routine PSA lab today  Fatigue-continue plan for sleep study this week  CKD 2-chronic kidney disease stage II mild. I strongly recommend hydrating with 80 to 100 ounces of water daily Due to abnormal kidney function, and in order to protect your kidneys, I recommend you avoid medications that can harm the kidneys such as ibuprofen, Aleve, Advil, Motrin, Naprosyn, or prescription anti-inflammatories which are used for pain, inflammation, and arthritis.   You should avoid dehydration which can harm the kidneys. lets continue to work to keep blood sugar and blood pressures under control  Neuropathy of legs-I reviewed neurological notes from Dr. Brett Fairy from last year.  You have had prior nerve conduction studies.  Given your borderline diabetes levels in the past, neurology felt like this could be the cause of his neuropathy symptoms.  We can increase gabapentin/Neurontin medication that you had tried before if it was not helping.  Pains in the hands and wrist- prior normal uric acid and sed rate labs.  Likely osteoarthritis.  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    Snoring, sleep disturbance - discussed possible causes, he has sleep study appointment this week  Elevated uric acid-update labs recently were normal

## 2021-05-17 ENCOUNTER — Other Ambulatory Visit: Payer: Self-pay | Admitting: Medical

## 2021-05-17 LAB — HEMOGLOBIN A1C
Est. average glucose Bld gHb Est-mCnc: 131 mg/dL
Hgb A1c MFr Bld: 6.2 % — ABNORMAL HIGH (ref 4.8–5.6)

## 2021-05-17 LAB — LIPID PANEL
Chol/HDL Ratio: 4.6 ratio (ref 0.0–5.0)
Cholesterol, Total: 192 mg/dL (ref 100–199)
HDL: 42 mg/dL (ref >39)
LDL Chol Calc (NIH): 123 mg/dL — ABNORMAL HIGH (ref 0–99)
Triglycerides: 150 mg/dL — ABNORMAL HIGH (ref 0–149)
VLDL Cholesterol Cal: 27 mg/dL (ref 5–40)

## 2021-05-17 LAB — PSA: Prostate Specific Ag, Serum: 0.7 ng/mL (ref 0.0–4.0)

## 2021-05-17 MED ORDER — EMPAGLIFLOZIN 10 MG PO TABS: 10.0000 mg | ORAL_TABLET | Freq: Every day | ORAL | 2 refills | Status: DC

## 2021-05-17 MED ORDER — CARVEDILOL 6.25 MG PO TABS: 6.2500 mg | ORAL_TABLET | Freq: Two times a day (BID) | ORAL | 3 refills | Status: DC

## 2021-05-17 MED ORDER — GABAPENTIN 300 MG PO CAPS: 300.0000 mg | ORAL_CAPSULE | Freq: Two times a day (BID) | ORAL | 2 refills | Status: DC

## 2021-05-17 MED ORDER — AMLODIPINE BESYLATE 5 MG PO TABS: 5.0000 mg | ORAL_TABLET | Freq: Every day | ORAL | 3 refills | Status: DC

## 2021-05-17 MED ORDER — LOSARTAN POTASSIUM 50 MG PO TABS
50.0000 mg | ORAL_TABLET | Freq: Every day | ORAL | 3 refills | Status: DC
Start: 2021-05-17 — End: 2021-05-25

## 2021-05-17 MED ORDER — ATORVASTATIN CALCIUM 80 MG PO TABS: 80.0000 mg | ORAL_TABLET | Freq: Every day | ORAL | 2 refills | Status: DC

## 2021-05-18 ENCOUNTER — Ambulatory Visit (HOSPITAL_BASED_OUTPATIENT_CLINIC_OR_DEPARTMENT_OTHER): Payer: 59 | Attending: Medical | Admitting: Internal Medicine

## 2021-05-18 ENCOUNTER — Other Ambulatory Visit: Payer: Self-pay

## 2021-05-18 VITALS — Ht 72.0 in | Wt 250.0 lb

## 2021-05-18 DIAGNOSIS — R0683 Snoring: Secondary | ICD-10-CM

## 2021-05-18 DIAGNOSIS — R5383 Other fatigue: Secondary | ICD-10-CM | POA: Diagnosis not present

## 2021-05-18 DIAGNOSIS — R0902 Hypoxemia: Secondary | ICD-10-CM | POA: Diagnosis not present

## 2021-05-18 DIAGNOSIS — G4733 Obstructive sleep apnea (adult) (pediatric): Secondary | ICD-10-CM | POA: Insufficient documentation

## 2021-05-25 ENCOUNTER — Encounter: Payer: Self-pay | Admitting: Cardiology

## 2021-05-25 ENCOUNTER — Other Ambulatory Visit: Payer: Self-pay

## 2021-05-25 ENCOUNTER — Ambulatory Visit: Payer: 59 | Admitting: Cardiology

## 2021-05-25 VITALS — BP 140/82 | HR 88 | Ht 72.0 in | Wt 246.2 lb

## 2021-05-25 DIAGNOSIS — E7849 Other hyperlipidemia: Secondary | ICD-10-CM

## 2021-05-25 DIAGNOSIS — G9331 Postviral fatigue syndrome: Secondary | ICD-10-CM

## 2021-05-25 DIAGNOSIS — R0600 Dyspnea, unspecified: Secondary | ICD-10-CM | POA: Diagnosis not present

## 2021-05-25 DIAGNOSIS — G933 Postviral fatigue syndrome: Secondary | ICD-10-CM

## 2021-05-25 DIAGNOSIS — E8881 Metabolic syndrome: Secondary | ICD-10-CM | POA: Insufficient documentation

## 2021-05-25 DIAGNOSIS — R0609 Other forms of dyspnea: Secondary | ICD-10-CM

## 2021-05-25 DIAGNOSIS — I700 Atherosclerosis of aorta: Secondary | ICD-10-CM

## 2021-05-25 DIAGNOSIS — R400 Somnolence: Secondary | ICD-10-CM | POA: Diagnosis not present

## 2021-05-25 DIAGNOSIS — G479 Sleep disorder, unspecified: Secondary | ICD-10-CM

## 2021-05-25 DIAGNOSIS — I10 Essential (primary) hypertension: Secondary | ICD-10-CM

## 2021-05-25 MED ORDER — AMLODIPINE BESYLATE 5 MG PO TABS: 5.0000 mg | ORAL_TABLET | Freq: Every day | ORAL | 3 refills | Status: DC

## 2021-05-25 MED ORDER — METOPROLOL TARTRATE 50 MG PO TABS: 100.0000 mg | ORAL_TABLET | Freq: Once | ORAL | 0 refills | Status: DC

## 2021-05-25 MED ORDER — CARVEDILOL 6.25 MG PO TABS: ORAL_TABLET | ORAL | 3 refills | Status: DC

## 2021-05-25 MED ORDER — LOSARTAN POTASSIUM 100 MG PO TABS: 100.0000 mg | ORAL_TABLET | Freq: Every morning | ORAL | 3 refills | Status: DC

## 2021-05-25 NOTE — Progress Notes (Signed)
Primary Care Provider: Carlena Hurl, PA-C Cardiologist: None Electrophysiologist: None Dr. Asencion Partridge Dohmeier, neurology Dr. Jackquline Denmark, GI (also has been seen by Dr. Benson Norway) Dr. Daneen Schick, Cardiology Dr. Joni Fears, Ortho   Clinic Note: Chief Complaint  Patient presents with   New Patient (Initial Visit)    Consult referral for dyspnea and fatigue   Shortness of Breath    Exertional, and at rest   Fatigue    Chronic.  It may be associated with OSA/insomnia     ===================================  ASSESSMENT/PLAN   Problem List Items Addressed This Visit       Cardiology Problems   Hyperlipidemia due to dietary fat intake (Chronic)    Lipids just checked showed LDL of 123.  With aortic atherosclerosis and coronary atherosclerosis, will likely need more aggressive management at least LDL less than 100 given his CRFs of age, male, prediabetes and hypertension (borderline metabolic syndrome).  Plan: For now continue current dose of atorvastatin that was apparently recently started.  (He had been on rosuvastatin but has not been taking for 2 to 3 years). With profound exertional dyspnea and coronary calcification on CT, would need an ischemic evaluation-class I indication for cardiac CTA which provides coronary calcium score and risk stratification regardless of whether there is or is not an FFR positive ischemic lesion.  Based on restratification, may need more aggressive management.      Relevant Medications   losartan (COZAAR) 100 MG tablet   amLODipine (NORVASC) 5 MG tablet   carvedilol (COREG) 6.25 MG tablet   Essential hypertension (Chronic)    Borderline elevated pressure today.  Plan: -increase losartan to 100 mg, while we are reducing carvedilol morning dose to 1/2 tablet potentially to help fatigue. He is on amlodipine 5 mg which could be increased to 10 mg well.  Reassess in follow-up.      Relevant Medications   losartan (COZAAR) 100 MG tablet    amLODipine (NORVASC) 5 MG tablet   carvedilol (COREG) 6.25 MG tablet   Aortic atherosclerosis (HCC) (Chronic)    Progressive instructor modification-anticipate closer management of lipids with LDL goal of least less than 100 if not less than 70 based on coronary CTA results.  Continue blood pressure management, increasing ARB dose because we are going to reduce morning dose of carvedilol. Close glycemic control. Diet and increase exercise.  May need additional medication versus converting to more potent ARB.      Relevant Medications   losartan (COZAAR) 100 MG tablet   amLODipine (NORVASC) 5 MG tablet   carvedilol (COREG) 6.25 MG tablet   Other Relevant Orders   CT CORONARY MORPH W/CTA COR W/SCORE W/CA W/CM &/OR WO/CM     Other   Metabolic syndrome (Chronic)    Hypertension, obesity, borderline elevated triglycerides and glucose intolerance -> increased cardiovascular risk factors.  With evidence of coronary artery calcification and aortic calcification, restratification with coronary calcium score is warranted, however with him now having exertional dyspnea, would recommend coronary CTA for more detailed restratification.  For now continue atorvastatin.  A1c is 6.2, pending results of coronary CTA, would consider SGLT2 inhibitor, and/or with obesity, could consider GLP-1 agonist for both prediabetes and cardiovascular benefit.      Relevant Orders   Basic metabolic panel (Completed)   Dyspnea on minimal exertion - Primary    Minimal exertion causes pretty significant dyspnea.  I am concerned that this can potentially be an anginal equivalent.  Simply getting short of breath bowling  a bowling ball is concerning.  If not cardiac in nature, then would warrant further detailed pulmonary.  Plan: 2D echo, Cardiac CTA (with possible FFR CT)      Relevant Orders   EKG 12-Lead (Completed)   CT CORONARY MORPH W/CTA COR W/SCORE W/CA W/CM &/OR WO/CM   Basic metabolic panel (Completed)    ECHOCARDIOGRAM COMPLETE   Daytime somnolence   Sleep disturbance (Chronic)    I suspect that this is part of the reason for his daytime somnolence and fatigue as well as even potentially dyspnea.  It sounds like he may have sleep apnea, but also has insomnia.  I suspect that he would pretty much need sleep medicine to formally evaluate even if he needs CPAP.      Fatigue (Chronic)    Daytime fatigue is probably multifactorial, I am sure that there is a component of daytime somnolence from possible sleep disturbance which could be combination of OSA and or poor sleep etiquette and true insomnia.  Recommend sleep medicine follow-up after sleep study evaluation. There is also possible component of postviral fatigue as a residual from his COVID infection.  Checking a 2D echo to ensure there is no abnormal cardiac findings..  Potential medical changes to be made will be to reduce beta-blocker dose to allow for some increase in energy level.  We will give a trial run of reducing morning dose of carvedilol to 1/2tablet, and doing so we will increase losartan to 100 mg.      Relevant Orders   EKG 12-Lead (Completed)   CT CORONARY MORPH W/CTA COR W/SCORE W/CA W/CM &/OR WO/CM   Basic metabolic panel (Completed)    ===================================  HPI:    Dave Carpenter is a 65 y.o. male with a PMH notable for HTN, HLD, Prediabetes/Impaired Glucose Level, Aortic Atherosclerosis (calcification on CT scan), Insomnia/Possible Sleep Apnea, and recent COVID-19 infection who presents today for EVALUATION OF FATIGUE, DYSPNEA AND OCCASIONAL CHEST TIGHTNESS at the request of [place], Pipe was last seen on June 06, 2016 by Dr. Tamala Julian in follow-up from an echocardiogram and chest CT.  There is no evidence of pericardial effusion, pulm hypertension or significant coronary plaque.  He was having nonexertional chest discomfort, therefore ischemic evaluation was not yet done, especially  in light of minimal coronary calcium.  Suspected that some of the chest, could be a lipoma in the mid thoracic level.  Also noted thickened esophagus. Chest pain felt to be noncardiac in nature.  Recommended maintaining LDL less than 100 otherwise no further cardiac evaluation.  He is status post right lower lobe partial lobectomy for lung mass in 2007.  Was found to be nonmalignant.  He does have some basal scarring seen on x-ray and CT.  Recent Hospitalizations: None  Diagnosed with COVID late July-early August.  Still has fatigue, just feels more tired and short of breath since then.  Worsening lethargy.  Episodic dyspnea.  He was seen by Mr. Tysinger on September 12.  No comment made about cardiology referral. ->  However, shortly after the visit, he was contacted about referral for cardiology.  Reviewed  CV studies:    The following studies were reviewed today: (if available, images/films reviewed: From Epic Chart or Care Everywhere) October 2010-Myoview: Exercise capacity-9 METS.  EKG negative.  EF 62%.  No R WMA.  No ischemia or infarction. September 2017-2D Echo: Normal LV size and function.  GR 1 DD.  EF 55 to 60%.  No wall motion normality.  Normal valves. Sleep study ordered 05/18/2021-Home sleep test done 05/23/2021-results not done (previously felt to be because prohibitive)   Interval History:   GRACYN SANTILLANES presents today somewhat unclear as to why he is being seen, but has several complaints.  For the most part, he says that he just has no energy.  He is tired all the time.  It lasts all day, not necessarily just worse at the end of the day.    He says he does not sleep well.  Usually when he gets off work, he takes a shower and gets up to eat, and then made to resolve for a little bit-about 2 hours.  Then wakes up somewhere between 9 and 10:00 only to stay awake until about 11 PM-midnight at which time he tries to go to bed.  Unfortunately he lies around and it will be 3:30  before he goes to sleep.  He will then wake up several between 6 and 6:30 possibly to urinate, and then just cannot get back to sleep.  He he says when he sleeps, he thinks he sleeps well, but does not feel rested.  He has already had his home sleep study done, but is awaiting the results.  He says he tries to stay active but his main activity is bowling.  When asked about symptoms while bowling, he tells me that he will get profoundly short of breath simply walking up and delivering the ball down the alley.  He gets very short of breath bending over and gets dizzy standing up.  He has to stand there to catch his breath for several minutes before he can sit back down again. Addition to this shortness of breath, he says he occasionally has the sensation of pushing/pressure sensation pushing from inside out that is deep in his chest and pushes out, taking his breath away.  He cannot tell me if this occurs with or without exertion-seemingly happens with both.  He does say that his fatigue and shortness of breath has definitely been worse since his COVID infection.  He has some mild end of day ankle swelling, but it does usually go down he puts his feet up.  CV Review of Symptoms (Summary) Cardiovascular ROS: positive for - chest pain, dyspnea on exertion, edema, palpitations, and dizziness & numbness, worse with positioning-bending over.  Extreme fatigue, exercise intolerance negative for - orthopnea, paroxysmal nocturnal dyspnea, rapid heart rate, or syncope/near syncope or TIA/amaurosis fugax, claudication  REVIEWED OF SYSTEMS   Review of Systems  Constitutional:  Positive for malaise/fatigue. Negative for weight loss.  HENT:  Negative for congestion and nosebleeds.   Respiratory:  Positive for shortness of breath (Per HPI). Negative for cough.   Cardiovascular:        Per HPI  Gastrointestinal:  Negative for abdominal pain, blood in stool, heartburn and melena.  Genitourinary:  Negative for  hematuria.       Nocturia  Musculoskeletal:  Positive for joint pain. Negative for falls.  Neurological:  Positive for dizziness (Per HPI). Negative for focal weakness and weakness.  Psychiatric/Behavioral:  The patient has insomnia (Versus poor sleep habits versus OSA.).    I have reviewed and (if needed) personally updated the patient's problem list, medications, allergies, past medical and surgical history, social and family history.   PAST MEDICAL HISTORY   Past Medical History:  Diagnosis Date   Allergy    Aortic atherosclerosis ([place]) 2017   Arthritis    knees,  shouders, wrists   Birth mark    upper abdomen   Elevated uric acid in blood 2018   Erectile dysfunction    GERD (gastroesophageal reflux disease)    History of seizure    few times as a child, none as an adult   Hyperlipidemia    Hypertension    Impaired fasting blood sugar 2018   Lipoma of back    since 2007 or earlier   Macular degeneration    Snoring 2018   sleep study declined due to cost   Wears glasses     PAST SURGICAL HISTORY   Past Surgical History:  Procedure Laterality Date   COLONOSCOPY  06/2018   Mild pancolonic diverticulosis of the sigmoid colon, Dr. Jackquline Denmark;  2008 with Dr. Benson Norway   ganglion cyst removal     left wrist   NM MYOVIEW LTD  08/2009   Exercise capacity-9 METS.  EKG negative.  EF 62%.  No R WMA.  No ischemia or infarction.   right lung surgery to remove mass Right 2007   Benign right lower lobe mass.   TONSILLECTOMY     TRANSTHORACIC ECHOCARDIOGRAM  05/2016   Normal LV size and function.  GR 1 DD.  EF 55 to 60%.  No wall motion normality.  Normal valves.    Immunization History  Administered Date(s) Administered   Influenza,inj,Quad PF,6+ Mos 05/28/2017, 05/08/2018, 05/13/2019, 05/13/2020, 05/16/2021   PFIZER(Purple Top)SARS-COV-2 Vaccination 11/18/2019, 12/08/2019, 08/01/2020   Tdap 04/10/2016    MEDICATIONS/ALLERGIES   Current Meds  Medication Sig   albuterol  (VENTOLIN HFA) 108 (90 Base) MCG/ACT inhaler Inhale 2 puffs into the lungs every 6 (six) hours as needed.   amLODipine (NORVASC) 5 MG tablet Take 1 tablet (5 mg total) by mouth daily.   atorvastatin (LIPITOR) 80 MG tablet Take 1 tablet (80 mg total) by mouth daily.   carvedilol (COREG) 6.25 MG tablet Take 1 tablet (6.25 mg total) by mouth 2 (two) times daily with a meal.   empagliflozin (JARDIANCE) 10 MG TABS tablet Take 1 tablet (10 mg total) by mouth daily before breakfast.   gabapentin (NEURONTIN) 300 MG capsule Take 1 capsule (300 mg total) by mouth 2 (two) times daily.   losartan (COZAAR) 50 MG tablet Take 1 tablet (50 mg total) by mouth daily.   omeprazole (PRILOSEC) 40 MG capsule TAKE 1 CAPSULE BY MOUTH EVERY DAY    Allergies  Allergen Reactions   Aspirin     Upset stomach, gets hives    SOCIAL HISTORY/FAMILY HISTORY   Reviewed in Epic:  Pertinent findings:  Social History   Tobacco Use   Smoking status: Former    Packs/day: 1.00    Years: 30.00    Pack years: 30.00    Types: Cigarettes    Quit date: 01/06/2003    Years since quitting: 18.4   Smokeless tobacco: Never  Vaping Use   Vaping Use: Never used  Substance Use Topics   Alcohol use: No    Alcohol/week: 0.0 standard drinks   Drug use: No   Social History   Social History Narrative   Married,  1 daughter, [employer]in [employer], [employer], bowling for exercise.   No particular diet discretion.  05/2020    OBJCTIVE -PE, EKG, labs   Wt Readings from Last 3 Encounters:  05/25/21 246 lb 3.2 oz (111.7 kg)  05/18/21 250 lb (113.4 kg)  05/16/21 249 lb 3.2 oz (113 kg)    Physical Exam: BP 140/82  Pulse 88   Ht 6' (1.829 m)   Wt 246 lb 3.2 oz (111.7 kg)   SpO2 99%   BMI 33.39 kg/m  Physical Exam Constitutional:      General: He is not in acute distress.    Appearance: Normal appearance. He is obese.     Comments: Pleasant, healthy-appearing.  HENT:     Head: Normocephalic and atraumatic.  Eyes:      Extraocular Movements: Extraocular movements intact.     Pupils: Pupils are equal, round, and reactive to light.  Neck:     Vascular: No carotid bruit or JVD.  Cardiovascular:     Rate and Rhythm: Normal rate and regular rhythm. No extrasystoles are present.    Chest Wall: PMI is not displaced.     Pulses: Normal pulses.     Heart sounds: S1 normal and S2 normal. Heart sounds are distant. No murmur heard.   No friction rub. No gallop.  Pulmonary:     Effort: Pulmonary effort is normal. No respiratory distress.     Breath sounds: Normal breath sounds. No wheezing, rhonchi or rales.  Abdominal:     General: Bowel sounds are normal. There is no distension.     Palpations: Abdomen is soft. There is no mass.     Tenderness: There is no abdominal tenderness.     Comments: Mildly obese.  Unable to assess HSM.  No bruit.  Musculoskeletal:        General: No swelling (May be trivial ankle edema.). Normal range of motion.     Cervical back: Normal range of motion and neck supple.  Skin:    General: Skin is dry.  Neurological:     General: No focal deficit present.     Mental Status: He is alert and oriented to person, place, and time.     Motor: No weakness.     Gait: Gait normal.  Psychiatric:        Mood and Affect: Mood normal.        Behavior: Behavior normal.        Thought Content: Thought content normal.        Judgment: Judgment normal.     Adult ECG Report  Rate: 88 ;  Rhythm: normal sinus rhythm and normal axis, intervals and durations. ;   Narrative Interpretation: Normal EKG  Recent Labs:  reviewed   Lab Results  Component Value Date   CHOL 192 05/16/2021   HDL 42 05/16/2021   LDLCALC 123 (H) 05/16/2021   TRIG 150 (H) 05/16/2021   CHOLHDL 4.6 05/16/2021   Lab Results  Component Value Date   CREATININE 1.28 (H) 05/25/2021   BUN 18 05/25/2021   NA 142 05/25/2021   K 4.1 05/25/2021   CL 106 05/25/2021   CO2 22 05/25/2021   CBC Latest Ref Rng & Units 03/17/2021  05/13/2020 01/22/2019  WBC 3.4 - 10.8 x10E3/uL 5.0 5.0 6.8  Hemoglobin 13.0 - 17.7 g/dL 14.2 14.2 14.7  Hematocrit 37.5 - 51.0 % 42.0 42.5 42.7  Platelets 150 - 450 x10E3/uL 245 224 286    Lab Results  Component Value Date   HGBA1C 6.2 (H) 05/16/2021   Lab Results  Component Value Date   TSH 1.570 03/17/2021    ==================================================  COVID-19 Education: The signs and symptoms of COVID-19 were discussed with the patient and how to seek care for testing (follow up with PCP or arrange E-visit).    I spent a total of 35 minutes  with the patient spent in direct patient consultation.  Additional time spent with chart review  / charting (studies, outside notes, etc): 26 min Total Time: 61 min  Current medicines are reviewed at length with the patient today.  (+/- concerns) n/a  This visit occurred during the SARS-CoV-2 public health emergency.  Safety protocols were in place, including screening questions prior to the visit, additional usage of staff PPE, and extensive cleaning of exam room while observing appropriate contact time as indicated for disinfecting solutions.  Notice: This dictation was prepared with Dragon dictation along with smaller phrase technology. Any transcriptional errors that result from this process are unintentional and may not be corrected upon review.  Patient Instructions / Medication Changes & Studies & Tests Ordered   New Meds Current Outpatient Medications  Medication Instructions   albuterol (VENTOLIN HFA) 108 (90 Base) MCG/ACT inhaler 2 puffs, Inhalation, Every 6 hours PRN   amLODipine (NORVASC) 5 mg, Oral, Daily at bedtime   atorvastatin (LIPITOR) 80 mg, Oral, Daily   carvedilol (COREG) 6.25 MG tablet TAKE  3.125 MG ( 1/2 TABLET OF 6.25 MG ) IN THE MORNING  AND 6.25 MG  IN THE EVENING   empagliflozin (JARDIANCE) 10 mg, Oral, Daily before breakfast   gabapentin (NEURONTIN) 300 mg, Oral, 2 times daily   losartan (COZAAR) 100  mg, Oral, Every morning   omeprazole (PRILOSEC) 40 MG capsule TAKE 1 CAPSULE BY MOUTH EVERY DAY    Patient Instructions  Medication Instructions:   Change to taking Losartan to 100 mg at morning daily   Amlodipine 5 mg at  bedtime   Carvedilol 3.125 mg ( 1/2 tablet of 6.25 mg ) in the morning and 6.25 mg in the evening   ( patient does not want a new prescription )    See instruction below - taking Metoprolol  and coreg the day of CCTA  *If you need a refill on your cardiac medications before your next appointment, please call your pharmacy*   Lab Work:  SEE INSTRUCTION BELOW - BMP  If you have labs (blood work) drawn today and your tests are completely normal, you will receive your results only by: MyChart Message (if you have MyChart) OR A paper copy in the mail If you have any lab test that is abnormal or we need to change your treatment, we will call you to review the results.   Testing/Procedures: WILL BE SCHEDULE QT [place] has requested that you have an echocardiogram. Echocardiography is a painless test that uses sound waves to create images of your heart. It provides your doctor with information about the size and shape of your heart and how well your heart's chambers and valves are working. This procedure takes approximately one hour. There are no restrictions for this procedure.  WILL BE SCHEDULE AT [facility] ONCE AUTHORIZATION IS OBTAINED BY YOUR INSURANCE   Your physician has requested that you have cardiac CTa. Cardiac computed tomography (CT) is a painless test that uses an x-ray machine to take clear, detailed pictures of your heart. Please follow instruction sheet as given.      Follow-Up: At [facility], you and your health needs are our priority.  As part of our continuing mission to provide you with exceptional heart care, we have created designated Provider Care Teams.  These Care  Teams include your primary Cardiologist (physician) and Advanced Practice Providers (APPs -  Physician Assistants and Nurse  Practitioners) who all work together to provide you with the care you need, when you need it.     Your next appointment:   2 month(s)  The format for your next appointment:   In Person  Provider:   Glenetta Hew, MD   Other Instructions     Your cardiac CT will be scheduled at  the below location:   [facility] [address] [place], [place] [address] [phone]    Please arrive at the [facility] main entrance (entrance A) of [facility] 30 minutes prior to test start time. Proceed to the [facility] Radiology Department (first floor) to check-in and test prep.    Please follow these instructions carefully (unless otherwise directed):    Please have lab done at least one week prior to test .   On the Night Before the Test: Be sure to Drink plenty of water. Do not consume any caffeinated/decaffeinated beverages or chocolate 12 hours prior to your test. Do not take any antihistamines 12 hours prior to your test.   On the Day of the Test: Drink plenty of water until 1 hour prior to the test. Do not eat any food 4 hours prior to the test. You may take your regular medications prior to the test.  Take metoprolol (Lopressor)  100 mg ( 2 tablets of 50 mg )  and carvedilol 6.25 mg that morning two hours prior to test.         After the Test: Drink plenty of water. After receiving IV contrast, you may experience a mild flushed feeling. This is normal. On occasion, you may experience a mild rash up to 24 hours after the test. This is not dangerous. If this occurs, you can take Benadryl 25 mg and increase your fluid intake. If you experience trouble breathing, this can be serious. If it is severe call 911 IMMEDIATELY. If it is mild, please call our office. If you take any of these medications: Glipizide/Metformin,  Avandament, Glucavance, please do not take 48 hours after completing test unless otherwise instructed.  Please allow 2-4 weeks for scheduling of routine cardiac CTs. Some insurance companies require a pre-authorization which may delay scheduling of this test.   For non-scheduling related questions, please contact the cardiac imaging nurse navigator should you have any questions/concerns: Marchia Bond, Cardiac Imaging Nurse Navigator Gordy Clement, Cardiac Imaging Nurse Navigator [facility] Direct Office Dial: [phone]   For scheduling needs, including cancellations and rescheduling, please call [place], [phone].    Studies Ordered:   Orders Placed This Encounter  Procedures   CT CORONARY MORPH W/CTA COR W/SCORE W/CA W/CM &/OR WO/CM   Basic metabolic panel   EKG 23-FTDD   ECHOCARDIOGRAM COMPLETE      Glenetta Hew, M.D., M.S. Interventional Cardiologist   Pager # [phone] Phone # [phone] [address] [place], [place] [address]   Thank you for choosing [facility] at [facility]!!

## 2021-05-25 NOTE — Patient Instructions (Addendum)
Medication Instructions:   Change to taking Losartan to 100 mg at morning daily   Amlodipine 5 mg at  bedtime   Carvedilol 3.125 mg ( 1/2 tablet of 6.25 mg ) in the morning and 6.25 mg in the evening   ( patient does not want a new prescription )    See instruction below - taking Metoprolol  and coreg the day of CCTA  *If you need a refill on your cardiac medications before your next appointment, please call your pharmacy*   Lab Work:  SEE INSTRUCTION BELOW - BMP  If you have labs (blood work) drawn today and your tests are completely normal, you will receive your results only by: [place] (if you have MyChart) OR A paper copy in the mail If you have any lab test that is abnormal or we need to change your treatment, we will call you to review the results.   Testing/Procedures: WILL BE SCHEDULE [facility] [place] has requested that you have an echocardiogram. Echocardiography is a painless test that uses sound waves to create images of your heart. It provides your doctor with information about the size and shape of your heart and how well your heart's chambers and valves are working. This procedure takes approximately one hour. There are no restrictions for this procedure.  WILL BE SCHEDULE AT [place] ONCE AUTHORIZATION IS OBTAINED BY YOUR INSURANCE   Your physician has requested that you have cardiac CTa. Cardiac computed tomography (CT) is a painless test that uses an x-ray machine to take clear, detailed pictures of your heart. Please follow instruction sheet as given.      Follow-Up: At [facility], you and your health needs are our priority.  As part of our continuing mission to provide you with exceptional heart care, we have created designated Provider Care Teams.  These Care Teams include your primary Cardiologist (physician) and Advanced Practice Providers (APPs -  Physician Assistants and Nurse  Practitioners) who all work together to provide you with the care you need, when you need it.     Your next appointment:   2 month(s)  The format for your next appointment:   In Person  Provider:   Glenetta Hew, MD   Other Instructions     Your cardiac CT will be scheduled at  the below location:   [facility] [address] [place], [place] [address] [phone]    Please arrive at the [facility] main entrance (entrance A) of [facility] 30 minutes prior to test start time. Proceed to the [facility] Radiology Department (first floor) to check-in and test prep.    Please follow these instructions carefully (unless otherwise directed):    Please have lab done at least one week prior to test .   On the Night Before the Test: Be sure to Drink plenty of water. Do not consume any caffeinated/decaffeinated beverages or chocolate 12 hours prior to your test. Do not take any antihistamines 12 hours prior to your test.   On the Day of the Test: Drink plenty of water until 1 hour prior to the test. Do not eat any food 4 hours prior to the test. You may take your regular medications prior to the test.  Take metoprolol (Lopressor)  100 mg ( 2 tablets of 50 mg )  and carvedilol 6.25 mg that morning two hours prior to test.  After the Test: Drink plenty of water. After receiving IV contrast, you may experience a mild flushed feeling. This is normal. On occasion, you may experience a mild rash up to 24 hours after the test. This is not dangerous. If this occurs, you can take Benadryl 25 mg and increase your fluid intake. If you experience trouble breathing, this can be serious. If it is severe call 911 IMMEDIATELY. If it is mild, please call our office. If you take any of these medications: Glipizide/Metformin, Avandament, Glucavance, please do not take 48 hours after completing test unless otherwise instructed.  Please allow 2-4 weeks for  scheduling of routine cardiac CTs. Some insurance companies require a pre-authorization which may delay scheduling of this test.   For non-scheduling related questions, please contact the cardiac imaging nurse navigator should you have any questions/concerns: Marchia Bond, Cardiac Imaging Nurse Navigator Gordy Clement, Cardiac Imaging Nurse Navigator  Heart and Vascular Services Direct Office Dial: [phone]   For scheduling needs, including cancellations and rescheduling, please call [place], [phone].

## 2021-05-26 LAB — BASIC METABOLIC PANEL
BUN/Creatinine Ratio: 14 (ref 10–24)
BUN: 18 mg/dL (ref 8–27)
CO2: 22 mmol/L (ref 20–29)
Calcium: 9.4 mg/dL (ref 8.6–10.2)
Chloride: 106 mmol/L (ref 96–106)
Creatinine, Ser: 1.28 mg/dL — ABNORMAL HIGH (ref 0.76–1.27)
Glucose: 94 mg/dL (ref 65–99)
Potassium: 4.1 mmol/L (ref 3.5–5.2)
Sodium: 142 mmol/L (ref 134–144)
eGFR: 62 mL/min/{1.73_m2} (ref >59)

## 2021-05-27 ENCOUNTER — Encounter: Payer: Self-pay | Admitting: Cardiology

## 2021-05-27 NOTE — Assessment & Plan Note (Signed)
I suspect that this is part of the reason for his daytime somnolence and fatigue as well as even potentially dyspnea.  It sounds like he may have sleep apnea, but also has insomnia.  I suspect that he would pretty much need sleep medicine to formally evaluate even if he needs CPAP.

## 2021-05-27 NOTE — Assessment & Plan Note (Addendum)
Daytime fatigue is probably multifactorial, I am sure that there is a component of daytime somnolence from possible sleep disturbance which could be combination of OSA and or poor sleep etiquette and true insomnia.  Recommend sleep medicine follow-up after sleep study evaluation. There is also possible component of postviral fatigue as a residual from his COVID infection.  Checking a 2D echo to ensure there is no abnormal cardiac findings..  Potential medical changes to be made will be to reduce beta-blocker dose to allow for some increase in energy level.  We will give a trial run of reducing morning dose of carvedilol to 1/2tablet, and doing so we will increase losartan to 100 mg.

## 2021-05-27 NOTE — Assessment & Plan Note (Signed)
Lipids just checked showed LDL of 123.  With aortic atherosclerosis and coronary atherosclerosis, will likely need more aggressive management at least LDL less than 100 given his CRFs of age, male, prediabetes and hypertension (borderline metabolic syndrome).  Plan: For now continue current dose of atorvastatin that was apparently recently started.  (He had been on rosuvastatin but has not been taking for 2 to 3 years).  With profound exertional dyspnea and coronary calcification on CT, would need an ischemic evaluation-class I indication for cardiac CTA which provides coronary calcium score and risk stratification regardless of whether there is or is not an FFR positive ischemic lesion.  Based on restratification, may need more aggressive management.

## 2021-05-27 NOTE — Assessment & Plan Note (Signed)
Hypertension, obesity, borderline elevated triglycerides and glucose intolerance -> increased cardiovascular risk factors.  With evidence of coronary artery calcification and aortic calcification, restratification with coronary calcium score is warranted, however with him now having exertional dyspnea, would recommend coronary CTA for more detailed restratification.  For now continue atorvastatin.  A1c is 6.2, pending results of coronary CTA, would consider SGLT2 inhibitor, and/or with obesity, could consider GLP-1 agonist for both prediabetes and cardiovascular benefit.

## 2021-05-27 NOTE — Assessment & Plan Note (Signed)
Minimal exertion causes pretty significant dyspnea.  I am concerned that this can potentially be an anginal equivalent.  Simply getting short of breath bowling a bowling ball is concerning.  If not cardiac in nature, then would warrant further detailed pulmonary.  Plan: 2D echo, Cardiac CTA (with possible FFR CT)

## 2021-05-27 NOTE — Assessment & Plan Note (Signed)
Borderline elevated pressure today.  Plan:  -increase losartan to 100 mg, while we are reducing carvedilol morning dose to 1/2 tablet potentially to help fatigue.  He is on amlodipine 5 mg which could be increased to 10 mg well.  Reassess in follow-up.

## 2021-05-27 NOTE — Assessment & Plan Note (Addendum)
Progressive instructor modification-anticipate closer management of lipids with LDL goal of least less than 100 if not less than 70 based on coronary CTA results.  Continue blood pressure management, increasing ARB dose because we are going to reduce morning dose of carvedilol. Close glycemic control. Diet and increase exercise.  May need additional medication versus converting to more potent ARB.

## 2021-05-28 DIAGNOSIS — R5383 Other fatigue: Secondary | ICD-10-CM | POA: Diagnosis not present

## 2021-05-28 NOTE — Procedures (Signed)
    Patient Name: Dave Carpenter, Dave Carpenter Date: 05/19/2021 Gender: Male D.O.B: 01/20/1956 Age (years): 65 Referring Provider: Chana Bode Height (inches): 62 Interpreting Physician: Baird Lyons Dave Carpenter, ABSM Weight (lbs): 250 RPSGT: Jacolyn Reedy BMI: 34 MRN: [id number] Neck Size: <br>  CLINICAL INFORMATION Sleep Study Type: HST Indication for sleep study: OSA with Insomnia Epworth Sleepiness Score: 16  SLEEP STUDY TECHNIQUE A multi-channel overnight portable sleep study was performed. The channels recorded were: nasal airflow, thoracic respiratory movement, and oxygen saturation with a pulse oximetry. Snoring was also monitored.  MEDICATIONS Patient self administered medications include: none reported.  SLEEP ARCHITECTURE Patient was studied for 292 minutes. The sleep efficiency was 100.0 % and the patient was supine for 89.8%. The arousal index was 0.0 per hour.  RESPIRATORY PARAMETERS The overall AHI was 84.9 per hour, with a central apnea index of 0 per hour. The oxygen nadir was 65% during sleep.  CARDIAC DATA Mean heart rate during sleep was 85.8 bpm.  IMPRESSIONS - Severe obstructive sleep apnea occurred during this study (AHI = 84.9/h). - Oxygen desaturation was noted during this study (Min O2 = 65%). Mean O2 saturation 90%. - Patient snored.  DIAGNOSIS - Obstructive Sleep Apnea (G47.33) - Nocturnal Hypoxemia (G47.36)  RECOMMENDATIONS - Suggest CPAP titration sleep study or autopap. Other options would be based on clinical judgment. - Be careful with alcohol, sedatives and other CNS depressants that may worsen sleep apnea and disrupt normal sleep architecture. - Sleep hygiene should be reviewed to assess factors that may improve sleep quality. - Weight management and regular exercise should be initiated or continued.  [Electronically signed] 05/28/2021 10:27 AM  Baird Lyons Dave Carpenter, ABSM Diplomate, American Board of Sleep Medicine   NPI: [id number]                          [place] [place] [facility]  ELECTRONICALLY SIGNED ON:  05/28/2021, 10:24 AM [place] PH: [phone]   FX: [phone] [place]

## 2021-06-01 ENCOUNTER — Telehealth (HOSPITAL_COMMUNITY): Payer: Self-pay | Admitting: Emergency Medicine

## 2021-06-01 ENCOUNTER — Ambulatory Visit (HOSPITAL_COMMUNITY): Payer: 59

## 2021-06-01 DIAGNOSIS — R079 Chest pain, unspecified: Secondary | ICD-10-CM

## 2021-06-01 DIAGNOSIS — R0609 Other forms of dyspnea: Secondary | ICD-10-CM

## 2021-06-01 DIAGNOSIS — I2510 Atherosclerotic heart disease of native coronary artery without angina pectoris: Secondary | ICD-10-CM

## 2021-06-01 NOTE — Telephone Encounter (Signed)
Calling patient to review CCTA instructions however patient states he cannot afford the test right now and wishes to cancel the appt.  Marchia Bond RN Navigator Cardiac Imaging [facility] Heart and Vascular Services [phone] Office  [phone] Cell

## 2021-06-03 ENCOUNTER — Encounter (HOSPITAL_COMMUNITY): Payer: Self-pay

## 2021-06-03 ENCOUNTER — Ambulatory Visit (HOSPITAL_COMMUNITY): Payer: 59

## 2021-06-03 NOTE — Telephone Encounter (Signed)
I am not sure what the cost differences between cardiac CTA and Myoview.  If the Myoview is less expensive, maybe we should just do that then.  Glenetta Hew, MD

## 2021-06-08 NOTE — Addendum Note (Signed)
Addended by: Raiford Simmonds on: 06/08/2021 04:48 PM   Modules accepted: Orders

## 2021-06-08 NOTE — Telephone Encounter (Signed)
Patient return call. RN  informed patient   Dr Ellyn Hack would like to an Myoview  instead of CCTA ( cost factor - co-pay $1000) / patient states he can not afford that at this time.  Patient states he would do  the  [facility]iscan myoview if co- pay was not high. Patient aware  scheduling will contact him with date and time

## 2021-06-08 NOTE — Telephone Encounter (Signed)
Left message for patient to return call .  Would like to discuss  an Option for another test beside CCTA ( expensive)

## 2021-06-10 ENCOUNTER — Ambulatory Visit (HOSPITAL_COMMUNITY): Payer: 59 | Attending: Cardiovascular Disease

## 2021-06-10 ENCOUNTER — Other Ambulatory Visit: Payer: Self-pay

## 2021-06-10 DIAGNOSIS — R0609 Other forms of dyspnea: Secondary | ICD-10-CM

## 2021-06-10 HISTORY — PX: TRANSTHORACIC ECHOCARDIOGRAM: SHX275

## 2021-06-10 LAB — ECHOCARDIOGRAM COMPLETE
Area-P 1/2: 4.06 cm2
S' Lateral: 3 cm

## 2021-06-10 NOTE — Telephone Encounter (Signed)
Converting from Cardiac CTA to MyoviewST  Shared Decision Making/Informed Consent{  The risks [chest pain, shortness of breath, cardiac arrhythmias, dizziness, blood pressure fluctuations, myocardial infarction, stroke/transient ischemic attack, nausea, vomiting, allergic reaction, radiation exposure, metallic taste sensation and life-threatening complications (estimated to be 1 in 10,000)], benefits (risk stratification, diagnosing coronary artery disease, treatment guidance) and alternatives of a nuclear stress test were discussed in detail with Mr. Longshore and he agrees to proceed.    Glenetta Hew, MD

## 2021-06-10 NOTE — Addendum Note (Signed)
Addended by: Leonie Man on: 06/10/2021 02:04 PM   Modules accepted: Orders

## 2021-06-15 ENCOUNTER — Telehealth: Payer: Self-pay | Admitting: *Deleted

## 2021-06-15 NOTE — Telephone Encounter (Signed)
Open error 

## 2021-06-28 ENCOUNTER — Telehealth (HOSPITAL_COMMUNITY): Payer: Self-pay | Admitting: *Deleted

## 2021-06-28 NOTE — Telephone Encounter (Signed)
Patient given detailed instructions per Myocardial Perfusion Study Information Sheet for the test on 07/01/21 at 7:30. Patient notified to arrive 15 minutes early and that it is imperative to arrive on time for appointment to keep from having the test rescheduled.  If you need to cancel or reschedule your appointment, please call the office within 24 hours of your appointment. . Patient verbalized understanding.Dave Carpenter

## 2021-07-01 ENCOUNTER — Other Ambulatory Visit: Payer: Self-pay

## 2021-07-01 ENCOUNTER — Ambulatory Visit (HOSPITAL_COMMUNITY): Payer: 59 | Attending: Internal Medicine

## 2021-07-01 DIAGNOSIS — R079 Chest pain, unspecified: Secondary | ICD-10-CM | POA: Insufficient documentation

## 2021-07-01 HISTORY — PX: NM MYOVIEW LTD: HXRAD82

## 2021-07-01 LAB — MYOCARDIAL PERFUSION IMAGING
Angina Index: 0
Base ST Depression (mm): 0 mm
Duke Treadmill Score: 5
Estimated workload: 7
Exercise duration (min): 5 min
Exercise duration (sec): 0 s
LV dias vol: 73 mL (ref 62–150)
LV sys vol: 31 mL
MPHR: 156 {beats}/min
Nuc Stress EF: 58 %
Peak HR: 137 {beats}/min
Percent HR: 87 %
Rest HR: 85 {beats}/min
Rest Nuclear Isotope Dose: 10.2 mCi
SDS: 0
SRS: 0
SSS: 0
ST Depression (mm): 0 mm
Stress Nuclear Isotope Dose: 31.2 mCi
TID: 0.89

## 2021-07-01 MED ORDER — TECHNETIUM TC 99M TETROFOSMIN IV KIT
31.2000 | PACK | Freq: Once | INTRAVENOUS | Status: AC | PRN
  Administered 2021-07-01: 31.2 via INTRAVENOUS
  Filled 2021-07-01: qty 32

## 2021-07-01 MED ORDER — TECHNETIUM TC 99M TETROFOSMIN IV KIT
10.2000 | PACK | Freq: Once | INTRAVENOUS | Status: AC | PRN
  Administered 2021-07-01: 10.2 via INTRAVENOUS
  Filled 2021-07-01: qty 11

## 2021-07-08 ENCOUNTER — Encounter: Payer: Self-pay | Admitting: Cardiology

## 2021-07-08 ENCOUNTER — Ambulatory Visit: Payer: 59 | Admitting: Cardiology

## 2021-07-08 ENCOUNTER — Other Ambulatory Visit: Payer: Self-pay

## 2021-07-08 VITALS — BP 126/86 | HR 82 | Ht 72.0 in | Wt 249.4 lb

## 2021-07-08 DIAGNOSIS — I700 Atherosclerosis of aorta: Secondary | ICD-10-CM

## 2021-07-08 DIAGNOSIS — E8881 Metabolic syndrome: Secondary | ICD-10-CM

## 2021-07-08 DIAGNOSIS — R079 Chest pain, unspecified: Secondary | ICD-10-CM | POA: Diagnosis not present

## 2021-07-08 DIAGNOSIS — E7849 Other hyperlipidemia: Secondary | ICD-10-CM | POA: Diagnosis not present

## 2021-07-08 DIAGNOSIS — G9331 Postviral fatigue syndrome: Secondary | ICD-10-CM

## 2021-07-08 DIAGNOSIS — R0609 Other forms of dyspnea: Secondary | ICD-10-CM

## 2021-07-08 DIAGNOSIS — I10 Essential (primary) hypertension: Secondary | ICD-10-CM | POA: Diagnosis not present

## 2021-07-08 MED ORDER — ISOSORBIDE MONONITRATE ER 30 MG PO TB24: 30.0000 mg | ORAL_TABLET | Freq: Every day | ORAL | 2 refills | Status: AC

## 2021-07-08 NOTE — Progress Notes (Signed)
Primary Care Provider: Carlena Hurl, PA-C Cardiologist: Glenetta Hew, MD Electrophysiologist: None Dr. Larey Seat, neurology Dr. Jackquline Denmark, GI (also has been seen by Dr. Benson Norway) Dr. Daneen Schick, Cardiology Dr. Joni Fears, Ortho  Clinic Note: Chief Complaint  Patient presents with   Follow-up    Test results-Echocardiogram and Myoview   Chest Pain    Still having CP & DOE   ===================================  ASSESSMENT/PLAN   Problem List Items Addressed This Visit       Cardiology Problems   Hyperlipidemia due to dietary fat intake (Chronic)    Unfortunately, because of cost issues, he did not have a test that I had hoped he would have to give [place] much more data.  He had a Myoview that was negative, but he continues to have symptoms.  His lipids are out of control for his risk factors.  He is due to have labs checked in the next few months again, if not I will recheck them when he comes in to see me.  If still elevated, would titrate up his statin dose.      Relevant Medications   isosorbide mononitrate (IMDUR) 30 MG 24 hr tablet   Essential hypertension (Chronic)    Blood pressure is pretty well controlled on carvedilol.  He is not noticing any change in his fatigue with the reduced morning dose, so we will do simply go back to 625 mg twice daily. Continue amlodipine along with losartan.  For additional antianginal benefit when added Imdur as it may be microvascular in nature.  Otherwise blood pressure would not tolerate titrating up amlodipine at this point.      Relevant Medications   isosorbide mononitrate (IMDUR) 30 MG 24 hr tablet   Aortic atherosclerosis (HCC) (Chronic)    Unfortunately, plan for coronary CTA was not completed because of financial issues.  Risk factor modification with blood pressure and lipid control.  Anticipate titrating up statin.      Relevant Medications   isosorbide mononitrate (IMDUR) 30 MG 24 hr tablet      Other   Metabolic syndrome (Chronic)    Risk factor modification is crucial. I anticipate that there may still be a chance that we consider invasive evaluation for CAD intermittent or persistent follow-up.  We will also need to continue to monitor his blood pressure, lipid and glycemic control. Also stressed importance of dietary modification and exercise attempt if possible.      Exertional chest pain    Again, there are some typical and otherwise atypical features of this chest discomfort.  Negative Myoview was very reassuring.  This is somewhat disappointing because he is continue to have symptoms and the more definitive study-coronary CTA ordered and more appropriate.  Unfortunately financial reasons but him going for Myoview.  Plan: Follow-up in few months after starting Imdur, reassess symptoms to see if he is recovering COVID.  If symptoms still persist at that time would probably consider cardiac catheterization.  If symptoms occur at rest, would also consider cardiac catheterization.  He is already on beta-blocker calcium channel blocker and ARB as well as statin which was titrated to 80 mg.      Dyspnea on minimal exertion - Primary    Normal echo mild nonischemic Myoview.  This would argue against macrovascular disease, however he has been still noticing symptoms of been persistent.  My plan for now since there was a negative stress test and a normal echocardiogram would be to continue to titrate medications  and monitor for symptom improvement as he recovers from his COVID-19 infection.  I will see him back in the early part of next year, if symptoms persist at that time, I think he would likely need to consider cardiac catheterization.  For now we will add Imdur.  We need to ensure that he is taking aspirin.      Fatigue (Chronic)    Probably multifactorial, at least some component could very well be COVID which could also be the explanation for his dyspnea and chest discomfort.   As such, we will hold off on further evaluation until I see him back after few more months to see if he recovers.  No real improvement with reduced dose of beta-blocker so we will go back to 6.25 mg daily.      ===================================  HPI:    Dave Carpenter is a 65 y.o. male with a PMH notable for HTN, HLD, Prediabetes/Impaired Glucose Level, Aortic Atherosclerosis (calcification on CT scan), Insomnia/Possible Sleep Apnea, and recent COVID-19 infection (July-Aug 2022) who presents today for follow-up EVALUATION OF FATIGUE, DYSPNEA AND OCCASIONAL CHEST TIGHTNESS at the request of Carlena Hurl, PA-C-to discuss test results.  He was referred by Mr. Tysinger on 05/16/2021 & referred to Cardiology for SOE & CP.  Dave Carpenter was seen for re-consultation May 25, 2020 with several complaints - noting extreme fatigue/ exercise intolerance with DOE & CP. Tired all of the time, but does note poor sleep habits/hygiene (awaiting Home Sleep Study results).  Noted profound dyspnea with minimal exertion - just [place] as well as bending over.  Also notes a pressure sensation in the chest -- like a "pushing" sensation from inside out of his chest & takes his breath away - +/- with exertion.  Sx of fatigue & DOE/SOB definitely worse since COVID infection. Mild EOD Edema - goes down with elevation.  -> Plan was 2D echo and coronary CTA, however he could not afford a coronary CTA therefore we switched to the Myoview stress test.  Recent Hospitalizations:  None  Reviewed  CV studies:    The following studies were reviewed today: (if available, images/films reviewed: From Epic Chart or Care Everywhere) Sleep study ordered 05/18/2021-Home sleep test done 05/23/2021-results not done  Transthoracic Echo 06/10/2021: EF 55 to 60%.  Normal wall motion.  Normal relaxation.  Normal RV.  Normal valves.  Normal RA P/RVP. Myoview 07/01/2021: Exercised 5 minutes.  7.0 METS.   Reached 87% max predicted HR of 137 bpm.  No angina.  Stopped due to fatigue.  No negative EKG GXT.  No ischemia or infarction.  EF 55 to 65%. - PERSONALLY REVIEWED.   Interval History:   Dave Carpenter presents today somewhat more stoic than before.  He says he still worn out, still has the that dyspnea and chest tightness.  He says able to walk maybe half a block without having to stop and catch his breath.  He is noticing the chest discomfort is.  He is not really having any symptoms at rest as far as dyspnea or chest discomfort.  He does note continued fatigue.  He also has chronic foot pain, but no longer notes any edema, dizziness or numbness.  CV Review of Symptoms (Summary) Cardiovascular ROS: positive for - chest pain, dyspnea on exertion, and dizziness with positioning-bending over.  Extreme fatigue, exercise intolerance negative for - edema, irregular heartbeat, loss of consciousness, orthopnea, paroxysmal nocturnal dyspnea, rapid heart rate, or near  syncope or TIA/amaurosis fugax, claudication  REVIEWED OF SYSTEMS   Review of Systems  Constitutional:  Positive for malaise/fatigue. Negative for weight loss.  HENT:  Negative for congestion and nosebleeds.   Respiratory:  Positive for shortness of breath (mild arthralgias). Negative for cough and wheezing.   Cardiovascular:  Negative for leg swelling.       Per HPI  Gastrointestinal:  Negative for abdominal pain, blood in stool, heartburn and melena.  Genitourinary:  Negative for hematuria.       Nocturia  Musculoskeletal:  Positive for joint pain. Negative for falls.  Neurological:  Positive for dizziness (Per HPI). Negative for focal weakness and weakness.  Psychiatric/Behavioral:  The patient has insomnia (Poor sleep etiquette vs. insomnia or OSA).    I have reviewed and (if needed) personally updated the patient's problem list, medications, allergies, past medical and surgical history, social and family history.   PAST  MEDICAL HISTORY   Past Medical History:  Diagnosis Date   Allergy    Aortic atherosclerosis ([place]) 2017   Arthritis    knees, shouders, wrists   Birth mark    upper abdomen   Elevated uric acid in blood 2018   Erectile dysfunction    GERD (gastroesophageal reflux disease)    History of seizure    few times as a child, none as an adult   Hyperlipidemia    Hypertension    Impaired fasting blood sugar 2018   Lipoma of back    since 2007 or earlier   Macular degeneration    Snoring 2018   sleep study declined due to cost   Wears glasses     PAST SURGICAL HISTORY   Past Surgical History:  Procedure Laterality Date   COLONOSCOPY  06/2018   Mild pancolonic diverticulosis of the sigmoid colon, Dr. Jackquline Denmark;  2008 with Dr. Benson Norway   ganglion cyst removal     left wrist   NM MYOVIEW LTD  08/2009   Exercise capacity-9 METS.  EKG negative.  EF 62%.  No R WMA.  No ischemia or infarction.   NM MYOVIEW LTD  07/01/2021   Ex: 5 min - 7.0 METS. 87% MPH (of 137). Negative EKG GXT. NO ISCHEMIA OR INFARCTION   right lung surgery to remove mass Right 2007   Benign right lower lobe mass.   TONSILLECTOMY     TRANSTHORACIC ECHOCARDIOGRAM  05/2016   Normal LV size and function.  GR 1 DD.  EF 55 to 60%.  No wall motion normality.  Normal valves.   TRANSTHORACIC ECHOCARDIOGRAM  06/10/2021   EF 55 to 60%.  Normal wall motion.  Normal relaxation.  Normal RV.  Normal valves.  Normal RA P/RVP.    Immunization History  Administered Date(s) Administered   Influenza,inj,Quad PF,6+ Mos 05/28/2017, 05/08/2018, 05/13/2019, 05/13/2020, 05/16/2021   PFIZER(Purple Top)SARS-COV-2 Vaccination 11/18/2019, 12/08/2019, 08/01/2020   Tdap 04/10/2016    MEDICATIONS/ALLERGIES    Current Outpatient Medications:    albuterol (VENTOLIN HFA) 108 (90 Base) MCG/ACT inhaler, Inhale 2 puffs into the lungs every 6 (six) hours as needed., Disp: , Rfl:    amLODipine (NORVASC) 5 MG tablet, Take 1 tablet (5 mg total) by  mouth at bedtime., Disp: 90 tablet, Rfl: 3   atorvastatin (LIPITOR) 80 MG tablet, Take 1 tablet (80 mg total) by mouth daily., Disp: 30 tablet, Rfl: 2   carvedilol (COREG) 6.25 MG tablet, TAKE  3.125 MG ( 1/2 TABLET OF 6.25 MG ) IN THE MORNING  AND 6.25  MG  IN THE EVENING, Disp: 135 tablet, Rfl: 3   empagliflozin (JARDIANCE) 10 MG TABS tablet, Take 1 tablet (10 mg total) by mouth daily before breakfast., Disp: 30 tablet, Rfl: 2   gabapentin (NEURONTIN) 300 MG capsule, Take 1 capsule (300 mg total) by mouth 2 (two) times daily., Disp: 60 capsule, Rfl: 2    losartan (COZAAR) 100 MG tablet, Take 1 tablet (100 mg total) by mouth every morning., Disp: 90 tablet, Rfl: 3   omeprazole (PRILOSEC) 40 MG capsule, TAKE 1 CAPSULE BY MOUTH EVERY DAY, Disp: 90 capsule, Rfl: 0    Allergies  Allergen Reactions   Aspirin     Upset stomach, gets hives    SOCIAL HISTORY/FAMILY HISTORY   Reviewed in Epic:  Pertinent findings:  Social History   Tobacco Use   Smoking status: Former    Packs/day: 1.00    Years: 30.00    Pack years: 30.00    Types: Cigarettes    Quit date: 01/06/2003    Years since quitting: 18.5   Smokeless tobacco: Never  Vaping Use   Vaping Use: Never used  Substance Use Topics   Alcohol use: No    Alcohol/week: 0.0 standard drinks   Drug use: No   Social History   Social History Narrative   Married,  1 daughter, [employer]in [employer], [employer], bowling for exercise.   No particular diet discretion.  05/2020    OBJCTIVE -PE, EKG, labs   Wt Readings from Last 3 Encounters:  07/08/21 249 lb 6.4 oz (113.1 kg)  07/01/21 246 lb (111.6 kg)  05/25/21 246 lb 3.2 oz (111.7 kg)    Physical Exam: BP 126/86 (BP Location: Left Arm, Patient Position: Sitting, Cuff Size: Normal)   Pulse 82   Ht 6' (1.829 m)   Wt 249 lb 6.4 oz (113.1 kg)   SpO2 98%   BMI 33.82 kg/m  Physical Exam Constitutional:      General: He is not in acute distress.    Appearance: Normal appearance. He is obese.      Comments: Pleasant, healthy-appearing.  HENT:     Head: Normocephalic and atraumatic.  Eyes:     Extraocular Movements: Extraocular movements intact.     Pupils: Pupils are equal, round, and reactive to light.  Neck:     Vascular: No carotid bruit or JVD.  Cardiovascular:     Rate and Rhythm: Normal rate and regular rhythm. No extrasystoles are present.    Chest Wall: PMI is not displaced.     Pulses: Normal pulses.     Heart sounds: S1 normal and S2 normal. Heart sounds are distant. No murmur heard.   No friction rub. No gallop.  Pulmonary:     Effort: Pulmonary effort is normal. No respiratory distress.     Breath sounds: Normal breath sounds. No wheezing, rhonchi or rales.  Abdominal:     General: Bowel sounds are normal. There is no distension.     Palpations: Abdomen is soft. There is no mass.     Tenderness: There is no abdominal tenderness.     Comments: Mildly obese.  Unable to assess HSM.  No bruit.  Musculoskeletal:        General: No swelling (May be trivial ankle edema.). Normal range of motion.     Cervical back: Normal range of motion and neck supple.  Skin:    General: Skin is dry.  Neurological:     General: No focal deficit present.  Mental Status: He is alert and oriented to person, place, and time.     Motor: No weakness.     Gait: Gait normal.  Psychiatric:        Mood and Affect: Mood normal.        Behavior: Behavior normal.        Thought Content: Thought content normal.        Judgment: Judgment normal.     Adult ECG Report  Rate: 88 ;  Rhythm: normal sinus rhythm and normal axis, intervals and durations. ;   Narrative Interpretation: Normal EKG  Recent Labs:  reviewed   Lab Results  Component Value Date   CHOL 192 05/16/2021   HDL 42 05/16/2021   LDLCALC 123 (H) 05/16/2021   TRIG 150 (H) 05/16/2021   CHOLHDL 4.6 05/16/2021   Lab Results  Component Value Date   CREATININE 1.28 (H) 05/25/2021   BUN 18 05/25/2021   NA 142  05/25/2021   K 4.1 05/25/2021   CL 106 05/25/2021   CO2 22 05/25/2021   CBC Latest Ref Rng & Units 03/17/2021 05/13/2020 01/22/2019  WBC 3.4 - 10.8 x10E3/uL 5.0 5.0 6.8  Hemoglobin 13.0 - 17.7 g/dL 14.2 14.2 14.7  Hematocrit 37.5 - 51.0 % 42.0 42.5 42.7  Platelets 150 - 450 x10E3/uL 245 224 286    Lab Results  Component Value Date   HGBA1C 6.2 (H) 05/16/2021   Lab Results  Component Value Date   TSH 1.570 03/17/2021    ==================================================  COVID-19 Education: The signs and symptoms of COVID-19 were discussed with the patient and how to seek care for testing (follow up with PCP or arrange E-visit).    I spent a total of 25 minutes with the patient spent in direct patient consultation.  Additional time spent with chart review  / charting (studies, outside notes, etc): 18 min Total Time: 43 min  Current medicines are reviewed at length with the patient today.  (+/- concerns) n/a  This visit occurred during the SARS-CoV-2 public health emergency.  Safety protocols were in place, including screening questions prior to the visit, additional usage of staff PPE, and extensive cleaning of exam room while observing appropriate contact time as indicated for disinfecting solutions.  Notice: This dictation was prepared with Dragon dictation along with smaller phrase technology. Any transcriptional errors that result from this process are unintentional and may not be corrected upon review.  Patient Instructions / Medication Changes & Studies & Tests Ordered   New Meds Current Outpatient Medications  Medication Instructions   albuterol (VENTOLIN HFA) 108 (90 Base) MCG/ACT inhaler 2 puffs, Inhalation, Every 6 hours PRN   amLODipine (NORVASC) 5 mg, Oral, Daily at bedtime   atorvastatin (LIPITOR) 80 mg, Oral, Daily   carvedilol (COREG) 6.25 MG tablet TAKE  3.125 MG ( 1/2 TABLET OF 6.25 MG ) IN THE MORNING  AND 6.25 MG  IN THE EVENING   empagliflozin (JARDIANCE)  10 mg, Oral, Daily before breakfast   gabapentin (NEURONTIN) 300 mg, Oral, 2 times daily   losartan (COZAAR) 100 mg, Oral, Every morning   omeprazole (PRILOSEC) 40 MG capsule TAKE 1 CAPSULE BY MOUTH EVERY DAY    Patient Instructions  Medication Instructions:    Start  taking Isosorbide MON 30 mg at bedtime   take 30 min ather taking tylenol.  *If you need a refill on your cardiac medications before your next appointment, please call your pharmacy*   Lab Work:  Not needed  Testing/Procedures: Not needed   Follow-Up: At [facility], you and your health needs are our priority.  As part of our continuing mission to provide you with exceptional heart care, we have created designated Provider Care Teams.  These Care Teams include your primary Cardiologist (physician) and Advanced Practice Providers (APPs -  Physician Assistants and Nurse Practitioners) who all work together to provide you with the care you need, when you need it.     Your next appointment:   3 month(s) FEB  The format for your next appointment:   In Person  Provider:   Glenetta Hew, MD      Studies Ordered:   No orders of the defined types were placed in this encounter.     Glenetta Hew, M.D., M.S. Interventional Cardiologist   Pager # [phone] Phone # [phone] [address] [place], [place] [address]   Thank you for choosing Heartcare at [facility]!!

## 2021-07-08 NOTE — Patient Instructions (Addendum)
Medication Instructions:    Start  taking Isosorbide MON 30 mg at bedtime   take 30 min ather taking tylenol.  *If you need a refill on your cardiac medications before your next appointment, please call your pharmacy*   Lab Work:  Not needed   Testing/Procedures: Not needed   Follow-Up: At [facility], you and your health needs are our priority.  As part of our continuing mission to provide you with exceptional heart care, we have created designated Provider Care Teams.  These Care Teams include your primary Cardiologist (physician) and Advanced Practice Providers (APPs -  Physician Assistants and Nurse Practitioners) who all work together to provide you with the care you need, when you need it.     Your next appointment:   3 month(s) FEB  The format for your next appointment:   In Person  Provider:   Glenetta Hew, MD

## 2021-07-09 ENCOUNTER — Encounter: Payer: Self-pay | Admitting: Cardiology

## 2021-07-09 NOTE — Assessment & Plan Note (Signed)
Normal echo mild nonischemic Myoview.  This would argue against macrovascular disease, however he has been still noticing symptoms of been persistent.  My plan for now since there was a negative stress test and a normal echocardiogram would be to continue to titrate medications and monitor for symptom improvement as he recovers from his COVID-19 infection.  I will see him back in the early part of next year, if symptoms persist at that time, I think he would likely need to consider cardiac catheterization.  For now we will add Imdur.  We need to ensure that he is taking aspirin.

## 2021-07-09 NOTE — Assessment & Plan Note (Signed)
Unfortunately, because of cost issues, he did not have a test that I had hoped he would have to give [place] much more data.  He had a Myoview that was negative, but he continues to have symptoms.  His lipids are out of control for his risk factors.  He is due to have labs checked in the next few months again, if not I will recheck them when he comes in to see me.  If still elevated, would titrate up his statin dose.

## 2021-07-09 NOTE — Assessment & Plan Note (Signed)
Blood pressure is pretty well controlled on carvedilol.  He is not noticing any change in his fatigue with the reduced morning dose, so we will do simply go back to 625 mg twice daily. Continue amlodipine along with losartan.  For additional antianginal benefit when added Imdur as it may be microvascular in nature.  Otherwise blood pressure would not tolerate titrating up amlodipine at this point.

## 2021-07-09 NOTE — Assessment & Plan Note (Signed)
Risk factor modification is crucial. I anticipate that there may still be a chance that we consider invasive evaluation for CAD intermittent or persistent follow-up.  We will also need to continue to monitor his blood pressure, lipid and glycemic control. Also stressed importance of dietary modification and exercise attempt if possible.

## 2021-07-09 NOTE — Assessment & Plan Note (Signed)
Again, there are some typical and otherwise atypical features of this chest discomfort.  Negative Myoview was very reassuring.  This is somewhat disappointing because he is continue to have symptoms and the more definitive study-coronary CTA ordered and more appropriate.  Unfortunately financial reasons but him going for Myoview.  Plan: Follow-up in few months after starting Imdur, reassess symptoms to see if he is recovering COVID.  If symptoms still persist at that time would probably consider cardiac catheterization.  If symptoms occur at rest, would also consider cardiac catheterization.  He is already on beta-blocker calcium channel blocker and ARB as well as statin which was titrated to 80 mg.

## 2021-07-09 NOTE — Assessment & Plan Note (Signed)
Unfortunately, plan for coronary CTA was not completed because of financial issues.  Risk factor modification with blood pressure and lipid control.  Anticipate titrating up statin.

## 2021-07-09 NOTE — Assessment & Plan Note (Signed)
Probably multifactorial, at least some component could very well be COVID which could also be the explanation for his dyspnea and chest discomfort.  As such, we will hold off on further evaluation until I see him back after few more months to see if he recovers.  No real improvement with reduced dose of beta-blocker so we will go back to 6.25 mg daily.

## 2021-07-10 ENCOUNTER — Other Ambulatory Visit: Payer: Self-pay | Admitting: Medical

## 2021-08-11 ENCOUNTER — Other Ambulatory Visit: Payer: Self-pay | Admitting: Medical

## 2021-10-26 ENCOUNTER — Ambulatory Visit: Payer: 59 | Admitting: Cardiology

## 2021-10-29 ENCOUNTER — Other Ambulatory Visit: Payer: Self-pay | Admitting: Medical

## 2021-10-31 NOTE — Telephone Encounter (Signed)
[facility] is requesting to fill pt gabapentin. Please advise [facility] 

## 2021-11-07 ENCOUNTER — Other Ambulatory Visit: Payer: Self-pay | Admitting: Medical

## 2021-11-15 ENCOUNTER — Other Ambulatory Visit: Payer: Self-pay

## 2021-11-15 ENCOUNTER — Ambulatory Visit (INDEPENDENT_AMBULATORY_CARE_PROVIDER_SITE_OTHER): Payer: 59 | Admitting: Medical

## 2021-11-15 VITALS — BP 120/84 | HR 87 | Wt 256.0 lb

## 2021-11-15 DIAGNOSIS — I10 Essential (primary) hypertension: Secondary | ICD-10-CM | POA: Diagnosis not present

## 2021-11-15 DIAGNOSIS — E7849 Other hyperlipidemia: Secondary | ICD-10-CM | POA: Diagnosis not present

## 2021-11-15 DIAGNOSIS — I700 Atherosclerosis of aorta: Secondary | ICD-10-CM

## 2021-11-15 DIAGNOSIS — N182 Chronic kidney disease, stage 2 (mild): Secondary | ICD-10-CM | POA: Diagnosis not present

## 2021-11-15 DIAGNOSIS — M79643 Pain in unspecified hand: Secondary | ICD-10-CM

## 2021-11-15 DIAGNOSIS — R7301 Impaired fasting glucose: Secondary | ICD-10-CM

## 2021-11-15 DIAGNOSIS — R200 Anesthesia of skin: Secondary | ICD-10-CM

## 2021-11-15 DIAGNOSIS — R29898 Other symptoms and signs involving the musculoskeletal system: Secondary | ICD-10-CM

## 2021-11-15 DIAGNOSIS — M7989 Other specified soft tissue disorders: Secondary | ICD-10-CM

## 2021-11-15 NOTE — Patient Instructions (Addendum)
?High cholesterol ?Continue atorvastatin 80 mg daily.  We increased his dose back in September 2022 as your LDL cholesterol is not at goal ? ? ?Impaired glucose/prediabetes ?We had switched you to Jardiance 10 mg daily back in September 2022.  Based on refill dates you appear to be out of this.  I do not think the pharmacy sent as a refill request ?Since you have changed insurance we will have to see if this is even covered by your current insurance ? ?Prediabetes means you have a higher than normal blood sugar level. It's not high enough to be considered type 2 diabetes yet, but without making some lifestyle changes you are more likely to develop type 2 diabetes.  If you have prediabetes, the long-term damage of diabetes, especially to your heart, blood vessels and kidneys may already be starting. You may not be able to change certain risk factors such as age, race, or family history, but you CAN make changes to your lifestyle, your eating habits, and your activity.  Although diabetes can develop at any age, the risk of prediabetes increases after age 35.  Your risk of prediabetes increases if you have a parent or sibling with type 2 diabetes.   Although it's unclear why, certain people including Black, Hispanic, American [place] American people, are more likely to develop prediabetes. ? ?Ways to prevent or slow progression to diabetes: ?Eat healthy foods - Eating red meat and processed meat, and drinking sugar-sweetened beverages, is associated with a higher risk of prediabetes. A diet high in fruits, vegetables, nuts, whole grains and olive oil is associated with a lower risk of prediabetes. ?Get at least 150 minutes of moderate aerobic physical activity a week, or about 30 minutes on most days of the week.  The less active you are, the greater your risk of prediabetes. Physical activity helps you control your weight, uses up sugar for energy and makes the body use insulin more effectively. ?Lose excess  weight - Being overweight is a primary risk factor for prediabetes. The more fatty tissue you have, especially inside and between the muscle and skin around your abdomen, the more resistant your cells become to insulin. ?Waist size. A large waist size can indicate insulin resistance. The risk of insulin resistance goes up for men with waists larger than 40 inches and for women with waists larger than 35 inches. ?Control your blood pressure and cholesterol.  If your blood pressure is not 130/80 or less, discuss with your provider to help get this under control.  It is ideal to have an HDL good cholesterol number >50 and have a LDL bad cholesterol number <100.   ?Don't smoke ?One simple strategy to help you make good food choices and eat appropriate portions sizes is to divide up your plate. These three divisions on your plate promote healthy eating: ? ?One-half: fruit and nonstarchy vegetables ?One-quarter: whole grains ?One-quarter: protein-rich foods, such as legumes, fish or lean meats ? ?Angina/chest pain ?Per cardiology you are on isosorbide/Imdur 30 mg daily to help with intermittent pain ? ? ?Acid reflux ?Continue omeprazole 40 mg daily fasting 30 to 45 minutes before breakfast ? ? ?History of neuropathy/numbness in the legs ?Continue gabapentin 300 mg twice daily ? ? ?Shortness of breath ?You have a prescription for albuterol rescue inhaler as needed, 2 puffs every 4-6 hours for shortness of breath wheezing or cough fits ? ? ?Hand numbness, pain, weakness ?This could be caused by various things which could include arthritis  or bulging disc in the neck, carpal tunnel syndrome, ulnar nerve syndrome, or various types of arthritis ?I am checking some additional labs today in this regard ?We may ultimately send you to orthopedics or start with a neck x-ray ? ? ?

## 2021-11-15 NOTE — Progress Notes (Signed)
Subjective: ?Chief Complaint  ?Patient presents with  ? follow-up  ?  Follow-up on Bs and cholesterol. Not checking blood sugars. Declines covid and shingles shot  ? ?Here for follow-up from last visit. ? ?Hypertension, hyperlipidemia, aortic atherosclerosis-in September 2022 visit we increased atorvastatin to 80 mg.  He is compliant with medication without complaint ? ?Impaired glucose, obesity, at risk for diabetes-in September we changed to [place] instead of Farxiga since Farxiga was not covered by insurance.  He was taking this but thinks he ran out of it in December ? ?Hypertension-compliant with medications ? ?In general he does not have his medicine bottles with him but says he is got several bottles on his shelf that he uses every morning.  He is not completely sure what he is taking ? ?New problems: ?In recent weeks having right hand numbness, sometimes pain sometimes swelling of the whole hand.  He is right-handed.  He has a history of neuropathy in the legs and prior nerve studies in legs but no prior nerve studies in the arms.  Occasionally has weakness in the left hand.  He does get neck pain ? ?Past Medical History:  ?Diagnosis Date  ? Allergy   ? Aortic atherosclerosis ([place]) 2017  ? Arthritis   ? knees, shouders, wrists  ? Birth mark   ? upper abdomen  ? Elevated uric acid in blood 2018  ? Erectile dysfunction   ? GERD (gastroesophageal reflux disease)   ? History of seizure   ? few times as a child, none as an adult  ? Hyperlipidemia   ? Hypertension   ? Impaired fasting blood sugar 2018  ? Lipoma of back   ? since 2007 or earlier  ? Macular degeneration   ? Snoring 2018  ? sleep study declined due to cost  ? Wears glasses   ? ?Current Outpatient Medications on File Prior to Visit  ?Medication Sig Dispense Refill  ? albuterol (VENTOLIN HFA) 108 (90 Base) MCG/ACT inhaler Inhale 2 puffs into the lungs every 6 (six) hours as needed.    ? amLODipine (NORVASC) 5 MG tablet Take 1 tablet (5 mg total) by  mouth at bedtime. 90 tablet 3  ? atorvastatin (LIPITOR) 80 MG tablet TAKE 1 TABLET BY MOUTH EVERY DAY 90 tablet 1  ? carvedilol (COREG) 6.25 MG tablet TAKE  3.125 MG ( 1/2 TABLET OF 6.25 MG ) IN THE MORNING  AND 6.25 MG  IN THE EVENING 135 tablet 3  ? empagliflozin (JARDIANCE) 10 MG TABS tablet Take 1 tablet (10 mg total) by mouth daily before breakfast. 30 tablet 2  ? gabapentin (NEURONTIN) 300 MG capsule TAKE 1 CAPSULE BY MOUTH TWICE A DAY 60 capsule 2  ? isosorbide mononitrate (IMDUR) 30 MG 24 hr tablet Take 1 tablet (30 mg total) by mouth at bedtime. 90 tablet 2  ? losartan (COZAAR) 100 MG tablet Take 1 tablet (100 mg total) by mouth every morning. 90 tablet 3  ? omeprazole (PRILOSEC) 40 MG capsule TAKE 1 CAPSULE BY MOUTH EVERY DAY 90 capsule 1  ? ?No current facility-administered medications on file prior to visit.  ? ?ROS as in subjective ? ? ?Objective: ?BP 120/84   Pulse 87   Wt 256 lb (116.1 kg)   BMI 34.72 kg/m?  ? ?General appearence: alert, no distress, WD/WN,  ?Neck: supple, no lymphadenopathy, no thyromegaly, no masses ?Heart: RRR, normal S1, S2, no murmurs ?Lungs: CTA bilaterally, no wheezes, rhonchi, or rales ?Pulses: 2+ symmetric, upper and  lower extremities, normal cap refill ? ? ?Diabetic Foot Exam - Simple   ?Simple Foot Form ?Diabetic Foot exam was performed with the following findings: Yes 11/15/2021 11:56 AM  ?Visual Inspection ?See comments: Yes ?Sensation Testing ?Intact to touch and monofilament testing bilaterally: Yes ?Pulse Check ?Posterior Tibialis and Dorsalis pulse intact bilaterally: Yes ?Comments ?Callous bilat distal phalanx of 5th toes bilat ?  ? ? ? ? ?Assessment: ?Encounter Diagnoses  ?Name Primary?  ? Aortic atherosclerosis ([place]) Yes  ? CKD (chronic kidney disease) stage 2, GFR 60-89 ml/min   ? Essential hypertension   ? Hyperlipidemia due to dietary fat intake   ? Impaired fasting blood sugar   ? Intermittent pain and swelling of hand   ? Hand weakness   ? Hand numbness    ? ? ? ?Plan: ?High blood pressure ?Your current medications are as follows: ?Amlodipine 5 mg daily ?Losartan 100 mg daily ?Carvedilol 6.25 mg, 1/2 tablet morning, 1 tablet at nighttime ? ? ?High cholesterol ?Continue atorvastatin 80 mg daily.  We increased his dose back in September 2022 as your LDL cholesterol is not at goal ? ? ?Impaired glucose/prediabetes ?We had switched you to Jardiance 10 mg daily back in September 2022.  Based on refill dates you appear to be out of this.  I do not think the pharmacy sent as a refill request ?Since you have changed insurance we will have to see if this is even covered by your current insurance ? ? ?Angina/chest pain ?Per cardiology you are on isosorbide/Imdur 30 mg daily to help with intermittent pain ? ? ?Acid reflux ?Continue omeprazole 40 mg daily fasting 30 to 45 minutes before breakfast ? ? ?History of neuropathy/numbness in the legs ?Continue gabapentin 300 mg twice daily ? ? ?Shortness of breath ?You have a prescription for albuterol rescue inhaler as needed, 2 puffs every 4-6 hours for shortness of breath wheezing or cough fits ? ? ?Hand numbness, pain, weakness ?This could be caused by various things which could include arthritis or bulging disc in the neck, carpal tunnel syndrome, ulnar nerve syndrome, or various types of arthritis ?I am checking some additional labs today in this regard ?We may ultimately send you to orthopedics or start with a neck x-ray ? ? ? ?Mingo was seen today for follow-up. ? ?Diagnoses and all orders for this visit: ? ?Aortic atherosclerosis ([place]) ? ?CKD (chronic kidney disease) stage 2, GFR 60-89 ml/min ? ?Essential hypertension ? ?Hyperlipidemia due to dietary fat intake ?-     Lipid panel ? ?Impaired fasting blood sugar ?-     Hemoglobin A1c ? ?Intermittent pain and swelling of hand ?-     CYCLIC CITRUL PEPTIDE ANTIBODY, IGG/IGA ?-     Rheumatoid factor ? ?Hand weakness ?-     CYCLIC CITRUL PEPTIDE ANTIBODY, IGG/IGA ?-     Rheumatoid  factor ? ?Hand numbness ?-     CYCLIC CITRUL PEPTIDE ANTIBODY, IGG/IGA ?-     Rheumatoid factor ? ? ? ?F/u pending labs. ?

## 2021-11-16 ENCOUNTER — Other Ambulatory Visit: Payer: Self-pay | Admitting: Medical

## 2021-11-16 DIAGNOSIS — R200 Anesthesia of skin: Secondary | ICD-10-CM

## 2021-11-16 DIAGNOSIS — M542 Cervicalgia: Secondary | ICD-10-CM

## 2021-11-16 DIAGNOSIS — R29898 Other symptoms and signs involving the musculoskeletal system: Secondary | ICD-10-CM

## 2021-11-16 DIAGNOSIS — M2550 Pain in unspecified joint: Secondary | ICD-10-CM

## 2021-11-16 DIAGNOSIS — M7989 Other specified soft tissue disorders: Secondary | ICD-10-CM

## 2021-11-17 LAB — LIPID PANEL
Chol/HDL Ratio: 5.3 ratio — ABNORMAL HIGH (ref 0.0–5.0)
Cholesterol, Total: 163 mg/dL (ref 100–199)
HDL: 31 mg/dL — ABNORMAL LOW (ref >39)
LDL Chol Calc (NIH): 106 mg/dL — ABNORMAL HIGH (ref 0–99)
Triglycerides: 144 mg/dL (ref 0–149)
VLDL Cholesterol Cal: 26 mg/dL (ref 5–40)

## 2021-11-17 LAB — HEMOGLOBIN A1C
Est. average glucose Bld gHb Est-mCnc: 134 mg/dL
Hgb A1c MFr Bld: 6.3 % — ABNORMAL HIGH (ref 4.8–5.6)

## 2021-11-17 LAB — RHEUMATOID FACTOR: Rheumatoid fact SerPl-aCnc: <10 IU/mL (ref <14.0)

## 2021-11-17 LAB — CYCLIC CITRUL PEPTIDE ANTIBODY, IGG/IGA: Cyclic Citrullin Peptide Ab: 2 units (ref 0–19)

## 2021-11-18 ENCOUNTER — Other Ambulatory Visit: Payer: Self-pay | Admitting: Medical

## 2021-11-22 ENCOUNTER — Ambulatory Visit (INDEPENDENT_AMBULATORY_CARE_PROVIDER_SITE_OTHER): Payer: 59 | Admitting: Physician Assistant

## 2021-11-22 ENCOUNTER — Encounter: Payer: Self-pay | Admitting: Physician Assistant

## 2021-11-22 ENCOUNTER — Other Ambulatory Visit: Payer: Self-pay

## 2021-11-22 DIAGNOSIS — M25531 Pain in right wrist: Secondary | ICD-10-CM | POA: Diagnosis not present

## 2021-11-22 DIAGNOSIS — M79641 Pain in right hand: Secondary | ICD-10-CM

## 2021-11-22 DIAGNOSIS — R29898 Other symptoms and signs involving the musculoskeletal system: Secondary | ICD-10-CM | POA: Diagnosis not present

## 2021-11-22 NOTE — Progress Notes (Signed)
? ?Office Visit Note ?  ?Patient: Dave Carpenter [facility]           ?Date of Birth: [birth year]           ?MRN: [id number] ?Visit Date: 11/22/2021 ?             ?Requested by: Carlena Hurl, PA-C ?[address] ?[place],  [place] [address] ?PCP: Carlena Hurl, PA-C ? ?Chief Complaint  ?Patient presents with  ? Right Hand - Weakness, Pain  ? ? ? ? ?HPI: ?Patient is a pleasant 66 year old gentleman with a over 1 year history of right hand tingling and numbness and pain.  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  He denies any injuries.  He denies any neck pain or radiation from his neck or shoulder down into his hand.  He does feel some of his hand pain when he turns his elbow a certain way.  Symptoms do not go proximal to the elbow ? ?Assessment & Plan: ?Visit Diagnoses:  ?1. Pain in right hand   ?2. Pain in right wrist   ?3. Hand weakness   ? ? ?Plan: Ulnar nerve entrapment versus carpal tunnel syndrome.  Based on his distribution of symptoms I think ulnar nerve entrapment may be more likely.  I do not think this is coming from his neck based on exam and distribution of his symptoms discussed the natural history of this has been going on over a year and getting worse.  I have referred patient to Dr. Ernestina Patches for nerve studies.  Once I receive the results I will refer him to Dr. Tempie Donning ? ?Follow-Up Instructions: No follow-ups on file.  ? ?Ortho Exam ? ?Patient is alert, oriented, no adenopathy, well-dressed, normal affect, normal respiratory effort. ?Examination of his right hand and arm.  His neck.  He moves his neck freely no reproduction of symptoms.  In his hand he has no swelling no redness.  Distribution of numbness is in the ring and pinky finger.  Occasionally per his report goes to the thumb.  He is able to oppose all his fingers easily.  He has a positive Tinel's but distribution also goes into the pinky.  He when palpating the ulnar nerve at the elbow it does reproduce symptoms in his hand.  Brisk  capillary refill in all of his digits ? ?Imaging: ?No results found. ?No images are attached to the encounter. ? ?Labs: ?Lab Results  ?Component Value Date  ? HGBA1C 6.3 (H) 11/15/2021  ? HGBA1C 6.2 (H) 05/16/2021  ? HGBA1C 6.3 (H) 05/13/2020  ? ESRSEDRATE 29 05/13/2020  ? LABURIC 6.1 05/13/2020  ? LABURIC 7.0 05/13/2019  ? LABURIC 7.2 05/08/2018  ? ? ? ?Lab Results  ?Component Value Date  ? ALBUMIN 4.2 03/17/2021  ? ALBUMIN 4.4 05/13/2020  ? ALBUMIN 4.6 05/13/2019  ? ? ?No results found for: MG ?No results found for: VD25OH ? ?No results found for: PREALBUMIN ?CBC EXTENDED Latest Ref Rng & Units 03/17/2021 05/13/2020 01/22/2019  ?WBC 3.4 - 10.8 x10E3/uL 5.0 5.0 6.8  ?RBC 4.14 - 5.80 x10E6/uL 4.54 4.54 4.66  ?HGB 13.0 - 17.7 g/dL 14.2 14.2 14.7  ?HCT 37.5 - 51.0 % 42.0 42.5 42.7  ?PLT 150 - 450 x10E3/uL 245 224 286  ?NEUTROABS 1.4 - 7.0 x10E3/uL - 2.9 3.7  ?LYMPHSABS 0.7 - 3.1 x10E3/uL - 1.5 2.1  ? ? ? ?There is no height or weight on file to calculate BMI. ? ?Orders:  ?Orders Placed This Encounter  ?  Procedures  ? Ambulatory referral to Physical Medicine Rehab  ? ?No orders of the defined types were placed in this encounter. ? ? ? Procedures: ?No procedures performed ? ?Clinical Data: ?No additional findings. ? ?ROS: ? ?All other systems negative, except as noted in the HPI. ?Review of Systems ? ?Objective: ?Vital Signs: There were no vitals taken for this visit. ? ?Specialty Comments:  ?No specialty comments available. ? ?PMFS History: ?Patient Active Problem List  ? Diagnosis Date Noted  ? Metabolic syndrome 00/17/4944  ? CKD (chronic kidney disease) stage 2, GFR 60-89 ml/min 05/16/2021  ? Fatigue 03/17/2021  ? Dyspnea on minimal exertion 03/17/2021  ? Daytime somnolence 03/17/2021  ? Polyarthralgia 05/13/2020  ? Sleep disturbance 05/13/2020  ? Aortic atherosclerosis ([place]) 05/13/2019  ? Birth mark 05/13/2019  ? Lipoma of torso 05/13/2019  ? Benign prostatic hyperplasia without lower urinary tract symptoms 05/13/2019   ? Neuropathy 01/22/2019  ? Impaired fasting blood sugar 05/08/2018  ? Need for influenza vaccination 05/08/2018  ? Screen for colon cancer 04/17/2017  ? Routine general medical examination at a health care facility 04/17/2017  ? Burning sensation of feet 04/17/2017  ? Exertional chest pain 05/01/2016  ? Vaccine counseling 04/10/2016  ? Macular degeneration 04/10/2016  ? Obesity 04/10/2016  ? Screening for prostate cancer 04/10/2016  ? Erectile dysfunction 01/06/2016  ? Gastroesophageal reflux disease without esophagitis 01/06/2016  ? Former smoker 01/06/2016  ? Essential hypertension 01/06/2016  ? Hyperlipidemia due to dietary fat intake 01/06/2016  ? Bruxism, sleep-related 03/24/2015  ? Snoring 03/24/2015  ? ?Past Medical History:  ?Diagnosis Date  ? Allergy   ? Aortic atherosclerosis ([place]) 2017  ? Arthritis   ? knees, shouders, wrists  ? Birth mark   ? upper abdomen  ? Elevated uric acid in blood 2018  ? Erectile dysfunction   ? GERD (gastroesophageal reflux disease)   ? History of seizure   ? few times as a child, none as an adult  ? Hyperlipidemia   ? Hypertension   ? Impaired fasting blood sugar 2018  ? Lipoma of back   ? since 2007 or earlier  ? Macular degeneration   ? Snoring 2018  ? sleep study declined due to cost  ? Wears glasses   ?  ?Family History  ?Problem Relation Age of Onset  ? Diabetes Mother   ? Stroke Mother   ? Other Mother   ?     brain tumor  ? Stroke Father   ? Diabetes Father   ? Heart disease Sister   ? Heart disease Brother   ? Cancer Neg Hx   ?  ?Past Surgical History:  ?Procedure Laterality Date  ? COLONOSCOPY  06/2018  ? Mild pancolonic diverticulosis of the sigmoid colon, Dr. Jackquline [place];  2008 with Dr. Benson [place]  ? ganglion cyst removal    ? left wrist  ? NM MYOVIEW LTD  08/2009  ? Exercise capacity-9 METS.  EKG negative.  EF 62%.  No R WMA.  No ischemia or infarction.  ? NM MYOVIEW LTD  07/01/2021  ? Ex: 5 min - 7.0 METS. 87% MPH (of 137). Negative EKG GXT. NO ISCHEMIA OR INFARCTION  ?  right lung surgery to remove mass Right 2007  ? Benign right lower lobe mass.  ? TONSILLECTOMY    ? TRANSTHORACIC ECHOCARDIOGRAM  05/2016  ? Normal LV size and function.  GR 1 DD.  EF 55 to 60%.  No wall motion normality.  Normal valves.  ?  TRANSTHORACIC ECHOCARDIOGRAM  06/10/2021  ? EF 55 to 60%.  Normal wall motion.  Normal relaxation.  Normal RV.  Normal valves.  Normal RA P/RVP.  ? ?Social History  ? ?Occupational History  ? Occupation: [employer]  ?Tobacco Use  ? Smoking status: Former  ?  Packs/day: 1.00  ?  Years: 30.00  ?  Pack years: 30.00  ?  Types: Cigarettes  ?  Quit date: 01/06/2003  ?  Years since quitting: 18.8  ? Smokeless tobacco: Never  ?Vaping Use  ? Vaping Use: Never used  ?Substance and Sexual Activity  ? Alcohol use: No  ?  Alcohol/week: 0.0 standard drinks  ? Drug use: No  ? Sexual activity: Not on file  ? ? ? ? ? ?

## 2021-12-20 ENCOUNTER — Encounter: Payer: Self-pay | Admitting: Physical Medicine and Rehabilitation

## 2021-12-20 ENCOUNTER — Ambulatory Visit (INDEPENDENT_AMBULATORY_CARE_PROVIDER_SITE_OTHER): Payer: 59 | Admitting: Physical Medicine and Rehabilitation

## 2021-12-20 DIAGNOSIS — R202 Paresthesia of skin: Secondary | ICD-10-CM

## 2021-12-20 NOTE — Progress Notes (Signed)
Pt state tingling, pain and weakness in both hands. Pt state he has pain in his right wrist the travels up to his elbow. Pt state he has weakness in both hand where he is dropping everything he picks up. Pt state he uses pain cream to help ease his pain. ? ?Numeric Pain Rating Scale and Functional Assessment ?Average Pain 2 ? ? ?In the last MONTH (on 0-10 scale) has pain interfered with the following? ? ?1. General activity like being  able to carry out your everyday physical activities such as walking, climbing stairs, carrying groceries, or moving a chair?  ?Rating(10) ? ? ? ? ?

## 2021-12-26 NOTE — Procedures (Signed)
EMG & NCV Findings: ?Evaluation of the right median (across palm) sensory nerve showed no response (Palm), prolonged distal peak latency (4.7 ms), and reduced amplitude (7.5 ?V).  The right ulnar sensory nerve showed prolonged distal peak latency (4.0 ms) and decreased conduction velocity (Wrist-5th Digit, 35 m/s).  All remaining nerves (as indicated in the following tables) were within normal limits.   ? ?All examined muscles (as indicated in the following table) showed no evidence of electrical instability.   ? ?Impression: ?The above electrodiagnostic study is ABNORMAL and reveals evidence of a mild right median nerve entrapment at the wrist (carpal tunnel syndrome) affecting sensory components.  ? ?There is no significant electrodiagnostic evidence of any other focal nerve entrapment, brachial plexopathy or cervical radiculopathy.  ? ?Recommendations: ?1.  Follow-up with referring physician. ?2.  Continue current management of symptoms. ? ?___________________________ ?Laurence Spates FAAPMR ?Board Certified, Tax adviser of Physical Medicine and Rehabilitation ? ? ? ?Nerve Conduction Studies ?Anti Sensory Summary Table ? ? Stim Site NR Peak (ms) Norm Peak (ms) P-T Amp (?V) Norm P-T Amp Site1 Site2 Delta-P (ms) Dist (cm) Vel (m/s) Norm Vel (m/s)  ?Right Median Acr Palm Anti Sensory (2nd Digit)  29.8?C  ?Wrist    *4.7 <3.6 *7.5 >10 Wrist Palm  0.0    ?Palm *NR  <2.0          ?Right Radial Anti Sensory (Base 1st Digit)  29.3?C  ?Wrist    2.5 <3.1 12.0  Wrist Base 1st Digit 2.5 0.0    ?Right Ulnar Anti Sensory (5th Digit)  29.9?C  ?Wrist    *4.0 <3.7 15.9 >15.0 Wrist 5th Digit 4.0 14.0 *35 >38  ? ?Motor Summary Table ? ? Stim Site NR Onset (ms) Norm Onset (ms) O-P Amp (mV) Norm O-P Amp Site1 Site2 Delta-0 (ms) Dist (cm) Vel (m/s) Norm Vel (m/s)  ?Right Median Motor (Abd Poll Brev)  29.5?C  ?Wrist    3.8 <4.2 6.8 >5 Elbow Wrist 5.0 25.5 51 >50  ?Elbow    8.8  4.8         ?Right Ulnar Motor (Abd Dig Min)  29.6?C  ?Wrist     3.3 <4.2 11.5 >3 B Elbow Wrist 4.5 24.0 53 >53  ?B Elbow    7.8  11.3  A Elbow B Elbow 1.8 10.0 56 >53  ?A Elbow    9.6  11.2         ? ?EMG ? ? Side Muscle Nerve Root Ins Act Fibs Psw Amp Dur Poly Recrt Int Fraser Din Comment  ?Right Abd Poll Brev Median C8-T1 Nml Nml Nml Nml Nml 0 Nml Nml   ?Right 1stDorInt Ulnar C8-T1 Nml Nml Nml Nml Nml 0 Nml Nml   ?Right PronatorTeres Median C6-7 Nml Nml Nml Nml Nml 0 Nml Nml   ?Right Biceps Musculocut C5-6 Nml Nml Nml Nml Nml 0 Nml Nml   ?Right Deltoid Axillary C5-6 Nml Nml Nml Nml Nml 0 Nml Nml   ? ? ?Nerve Conduction Studies ?Anti Sensory Left/Right Comparison ? ? Stim Site L Lat (ms) R Lat (ms) L-R Lat (ms) L Amp (?V) R Amp (?V) L-R Amp (%) Site1 Site2 L Vel (m/s) R Vel (m/s) L-R Vel (m/s)  ?Median Acr Palm Anti Sensory (2nd Digit)  29.8?C  ?Wrist  *4.7   *7.5  Wrist Palm     ?Palm             ?Radial Anti Sensory (Base 1st Digit)  29.3?C  ?Wrist  2.5   12.0  Wrist Base 1st Digit     ?Ulnar Anti Sensory (5th Digit)  29.9?C  ?Wrist  *4.0   15.9  Wrist 5th Digit  *35   ? ?Motor Left/Right Comparison ? ? Stim Site L Lat (ms) R Lat (ms) L-R Lat (ms) L Amp (mV) R Amp (mV) L-R Amp (%) Site1 Site2 L Vel (m/s) R Vel (m/s) L-R Vel (m/s)  ?Median Motor (Abd Poll Brev)  29.5?C  ?Wrist  3.8   6.8  Elbow Wrist  51   ?Elbow  8.8   4.8        ?Ulnar Motor (Abd Dig Min)  29.6?C  ?Wrist  3.3   11.5  B Elbow Wrist  53   ?B Elbow  7.8   11.3  A Elbow B Elbow  56   ?A Elbow  9.6   11.2        ? ? ? ?Waveforms: ?    ? ?   ? ? ?

## 2021-12-26 NOTE — Progress Notes (Signed)
? ?Dave Carpenter - 66 y.o. male MRN [id number]  Date of birth: 09/28/1955 ? ?Office Visit Note: ?Visit Date: 12/20/2021 ?PCP: Carlena Hurl, PA-C ?Referred by: Persons, Bevely Palmer, PA ? ?Subjective: ?Chief Complaint  ?Patient presents with  ? Right Hand - Pain, Weakness, Tingling  ? Left Hand - Pain, Weakness, Tingling  ? Right Wrist - Pain, Weakness, Tingling  ? Right Elbow - Pain  ? ?HPI:  Dave Carpenter is a 66 y.o. male who comes in today at the request of [place], PA-C for electrodiagnostic study of the Right upper extremities.  Patient is Right hand dominant.  He reports chronic worsening severe pain numbness and tingling somewhat nondermatomal in the right hand with some referral pain from the wrist to the elbow.  He gets some left-sided symptoms but very mild and just starting.  He reports weakness in the hands particularly when picking up objects.  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  No prior electrodiagnostic study.  No frank radicular symptoms. ? ?ROS Otherwise per HPI. ? ?Assessment & Plan: ?Visit Diagnoses:  ?  ICD-10-CM   ?1. Paresthesia of skin  R20.2 NCV with EMG (electromyography)  ?  ?  ?Plan: Impression: ?The above electrodiagnostic study is ABNORMAL and reveals evidence of a mild right median nerve entrapment at the wrist (carpal tunnel syndrome) affecting sensory components.  ? ?There is no significant electrodiagnostic evidence of any other focal nerve entrapment, brachial plexopathy or cervical radiculopathy.  ? ?Recommendations: ?1.  Follow-up with referring physician. ?2.  Continue current management of symptoms. ? ?Meds & Orders: No orders of the defined types were placed in this encounter. ?  ?Orders Placed This Encounter  ?Procedures  ? NCV with EMG (electromyography)  ?  ?Follow-up: Return in about 2 weeks (around 01/03/2022) for [place], PA-C.  ? ?Procedures: ?No procedures performed  ?EMG & NCV Findings: ?Evaluation of the right  median (across palm) sensory nerve showed no response (Palm), prolonged distal peak latency (4.7 ms), and reduced amplitude (7.5 ?V).  The right ulnar sensory nerve showed prolonged distal peak latency (4.0 ms) and decreased conduction velocity (Wrist-5th Digit, 35 m/s).  All remaining nerves (as indicated in the following tables) were within normal limits.   ? ?All examined muscles (as indicated in the following table) showed no evidence of electrical instability.   ? ?Impression: ?The above electrodiagnostic study is ABNORMAL and reveals evidence of a mild right median nerve entrapment at the wrist (carpal tunnel syndrome) affecting sensory components.  ? ?There is no significant electrodiagnostic evidence of any other focal nerve entrapment, brachial plexopathy or cervical radiculopathy.  ? ?Recommendations: ?1.  Follow-up with referring physician. ?2.  Continue current management of symptoms. ? ?___________________________ ?Laurence Spates FAAPMR ?Board Certified, Tax adviser of Physical Medicine and Rehabilitation ? ? ? ?Nerve Conduction Studies ?Anti Sensory Summary Table ? ? Stim Site NR Peak (ms) Norm Peak (ms) P-T Amp (?V) Norm P-T Amp Site1 Site2 Delta-P (ms) Dist (cm) Vel (m/s) Norm Vel (m/s)  ?Right Median Acr Palm Anti Sensory (2nd Digit)  29.8?C  ?Wrist    *4.7 <3.6 *7.5 >10 Wrist Palm  0.0    ?Palm *NR  <2.0          ?Right Radial Anti Sensory (Base 1st Digit)  29.3?C  ?Wrist    2.5 <3.1 12.0  Wrist Base 1st Digit 2.5 0.0    ?Right Ulnar Anti Sensory (5th  Digit)  29.9?C  ?Wrist    *4.0 <3.7 15.9 >15.0 Wrist 5th Digit 4.0 14.0 *35 >38  ? ?Motor Summary Table ? ? Stim Site NR Onset (ms) Norm Onset (ms) O-P Amp (mV) Norm O-P Amp Site1 Site2 Delta-0 (ms) Dist (cm) Vel (m/s) Norm Vel (m/s)  ?Right Median Motor (Abd Poll Brev)  29.5?C  ?Wrist    3.8 <4.2 6.8 >5 Elbow Wrist 5.0 25.5 51 >50  ?Elbow    8.8  4.8         ?Right Ulnar Motor (Abd Dig Min)  29.6?C  ?Wrist    3.3 <4.2 11.5 >3 B Elbow Wrist 4.5 24.0  53 >53  ?B Elbow    7.8  11.3  A Elbow B Elbow 1.8 10.0 56 >53  ?A Elbow    9.6  11.2         ? ?EMG ? ? Side Muscle Nerve Root Ins Act Fibs Psw Amp Dur Poly Recrt Int Fraser Din Comment  ?Right Abd Poll Brev Median C8-T1 Nml Nml Nml Nml Nml 0 Nml Nml   ?Right 1stDorInt Ulnar C8-T1 Nml Nml Nml Nml Nml 0 Nml Nml   ?Right PronatorTeres Median C6-7 Nml Nml Nml Nml Nml 0 Nml Nml   ?Right Biceps Musculocut C5-6 Nml Nml Nml Nml Nml 0 Nml Nml   ?Right Deltoid Axillary C5-6 Nml Nml Nml Nml Nml 0 Nml Nml   ? ? ?Nerve Conduction Studies ?Anti Sensory Left/Right Comparison ? ? Stim Site L Lat (ms) R Lat (ms) L-R Lat (ms) L Amp (?V) R Amp (?V) L-R Amp (%) Site1 Site2 L Vel (m/s) R Vel (m/s) L-R Vel (m/s)  ?Median Acr Palm Anti Sensory (2nd Digit)  29.8?C  ?Wrist  *4.7   *7.5  Wrist Palm     ?Palm             ?Radial Anti Sensory (Base 1st Digit)  29.3?C  ?Wrist  2.5   12.0  Wrist Base 1st Digit     ?Ulnar Anti Sensory (5th Digit)  29.9?C  ?Wrist  *4.0   15.9  Wrist 5th Digit  *35   ? ?Motor Left/Right Comparison ? ? Stim Site L Lat (ms) R Lat (ms) L-R Lat (ms) L Amp (mV) R Amp (mV) L-R Amp (%) Site1 Site2 L Vel (m/s) R Vel (m/s) L-R Vel (m/s)  ?Median Motor (Abd Poll Brev)  29.5?C  ?Wrist  3.8   6.8  Elbow Wrist  51   ?Elbow  8.8   4.8        ?Ulnar Motor (Abd Dig Min)  29.6?C  ?Wrist  3.3   11.5  B Elbow Wrist  53   ?B Elbow  7.8   11.3  A Elbow B Elbow  56   ?A Elbow  9.6   11.2        ? ? ? ?Waveforms: ?    ? ?   ? ?  ? ?Clinical History: ?No specialty comments available.  ? ? ? ?Objective:  VS:  HT:    WT:   BMI:     BP:   HR: bpm  TEMP: ( )  RESP:  ?Physical Exam ?Musculoskeletal:     ?   General: No tenderness.  ?   Comments: Inspection reveals no atrophy of the bilateral APB or FDI or hand intrinsics. There is no swelling, color changes, allodynia or dystrophic changes. There is 5 out of 5 strength in the bilateral wrist extension, finger  abduction and long finger flexion. There is intact sensation to light touch in all  dermatomal and peripheral nerve distributions.  There is a negative Hoffmann's test bilaterally.  ?Skin: ?   General: Skin is warm and dry.  ?   Findings: No erythema or rash.  ?Neurological:  ?   General: No focal deficit present.  ?   Mental Status: He is alert and oriented to person, place, and time.  ?   Sensory: No sensory deficit.  ?   Motor: No weakness or abnormal muscle tone.  ?   Coordination: Coordination normal.  ?   Gait: Gait normal.  ?Psychiatric:     ?   Mood and Affect: Mood normal.     ?   Behavior: Behavior normal.     ?   Thought Content: Thought content normal.  ?  ? ?Imaging: ?No results found. ?

## 2021-12-27 ENCOUNTER — Telehealth: Payer: Self-pay | Admitting: Physician Assistant

## 2021-12-27 NOTE — Telephone Encounter (Signed)
Pt returned call to [facility]. Please call pt at [phone] ?

## 2022-01-11 ENCOUNTER — Ambulatory Visit: Payer: 59 | Admitting: Orthopaedic Surgery

## 2022-01-24 ENCOUNTER — Encounter: Payer: Self-pay | Admitting: Orthopaedic Surgery

## 2022-01-24 ENCOUNTER — Ambulatory Visit (INDEPENDENT_AMBULATORY_CARE_PROVIDER_SITE_OTHER): Payer: 59 | Admitting: Orthopedic Surgery

## 2022-01-24 DIAGNOSIS — R202 Paresthesia of skin: Secondary | ICD-10-CM | POA: Diagnosis not present

## 2022-01-24 DIAGNOSIS — R200 Anesthesia of skin: Secondary | ICD-10-CM | POA: Diagnosis not present

## 2022-01-24 MED ORDER — LIDOCAINE HCL 1 % IJ SOLN
1.0000 mL | INTRAMUSCULAR | Status: AC | PRN
  Administered 2022-01-24: 1 mL

## 2022-01-24 MED ORDER — BETAMETHASONE SOD PHOS & ACET 6 (3-3) MG/ML IJ SUSP
6.0000 mg | INTRAMUSCULAR | Status: AC | PRN
  Administered 2022-01-24: 6 mg via INTRA_ARTICULAR

## 2022-01-24 NOTE — Progress Notes (Signed)
Office Visit Note   Patient: Dave Carpenter           Date of Birth: 11-21-1955           MRN: [id number] Visit Date: 01/24/2022              Requested by: Carlena Hurl, PA-C [address] [place],  [place] [address] PCP: Carlena Hurl, PA-C   Assessment & Plan: Visit Diagnoses:  1. Numbness and tingling in right hand     Plan: Patient's history and exam findings may be consistent with mild carpal tunnel syndrome which is what was suggested on the EMG/NCS.  We reviewed the nature of carpal tunnel syndrome and both conservative and surgical treatment options.  After our discussion, he would like to try a diagnostic and potentially therapeutic corticosteroid injection into the right carpal tunnel. I can see him back in a few months if he remains symptomatic.   Follow-Up Instructions: No follow-ups on file.   Orders:  No orders of the defined types were placed in this encounter.  No orders of the defined types were placed in this encounter.     Procedures: Hand/UE Inj: R carpal tunnel for carpal tunnel syndrome on 01/24/2022 4:07 PM Indications: therapeutic and diagnostic Details: 25 G needle, volar approach Medications: 1 mL lidocaine 1 %; 6 mg betamethasone acetate-betamethasone sodium phosphate 6 (3-3) MG/ML Procedure, treatment alternatives, risks and benefits explained, specific risks discussed. Consent was given by the patient. Immediately prior to procedure a time out was called to verify the correct patient, procedure, equipment, support staff and site/side marked as required. Patient was prepped and draped in the usual sterile fashion.      Clinical Data: No additional findings.   Subjective: Chief Complaint  Patient presents with   Right Wrist - Follow-up   This is a 66 year old right-hand-dominant male presents with numbness and tingling involving the entire right hand.  Is been going on for about a year now.  His symptoms are worse with activities such  as bowling.  He notes that he holds the ball of the wrist in a flexed position and this tends to make his fingers numb and tingling.  He was referred to me by one of my partners.  He recently underwent EMG/nerve conduction study which suggested a mild right carpal tunnel syndrome.  Review of Systems   Objective: Vital Signs: There were no vitals taken for this visit.  Physical Exam Constitutional:      Appearance: Normal appearance.  Cardiovascular:     Rate and Rhythm: Normal rate.     Pulses: Normal pulses.  Pulmonary:     Effort: Pulmonary effort is normal.  Skin:    General: Skin is warm and dry.     Capillary Refill: Capillary refill takes less than 2 seconds.  Neurological:     Mental Status: He is alert.    Right Hand Exam   Tenderness  The patient is experiencing no tenderness.   Range of Motion  The patient has normal right wrist ROM.   Other  Erythema: absent Sensation: normal Pulse: present  Comments:  Positive Durkan sign.  Negative Tinel and Phalen signs.  No evidence of atrophy of wasting.  5/5 thenar motor and FDI strength.      Specialty Comments:  No specialty comments available.  Imaging: No results found.   PMFS History: Patient Active Problem List   Diagnosis Date Noted   Numbness and tingling in right hand 01/24/2022  Metabolic syndrome 30/03/6225   CKD (chronic kidney disease) stage 2, GFR 60-89 ml/min 05/16/2021   Fatigue 03/17/2021   Dyspnea on minimal exertion 03/17/2021   Daytime somnolence 03/17/2021   Polyarthralgia 05/13/2020   Sleep disturbance 05/13/2020   Aortic atherosclerosis ([place]) 05/13/2019   Birth mark 05/13/2019   Lipoma of torso 05/13/2019   Benign prostatic hyperplasia without lower urinary tract symptoms 05/13/2019   Neuropathy 01/22/2019   Impaired fasting blood sugar 05/08/2018   Need for influenza vaccination 05/08/2018   Screen for colon cancer 04/17/2017   Routine general medical examination at a health  care facility 04/17/2017   Burning sensation of feet 04/17/2017   Exertional chest pain 05/01/2016   Vaccine counseling 04/10/2016   Macular degeneration 04/10/2016   Obesity 04/10/2016   Screening for prostate cancer 04/10/2016   Erectile dysfunction 01/06/2016   Gastroesophageal reflux disease without esophagitis 01/06/2016   Former smoker 01/06/2016   Essential hypertension 01/06/2016   Hyperlipidemia due to dietary fat intake 01/06/2016   Bruxism, sleep-related 03/24/2015   Snoring 03/24/2015   Past Medical History:  Diagnosis Date   Allergy    Aortic atherosclerosis ([place]) 2017   Arthritis    knees, shouders, wrists   Birth mark    upper abdomen   Elevated uric acid in blood 2018   Erectile dysfunction    GERD (gastroesophageal reflux disease)    History of seizure    few times as a child, none as an adult   Hyperlipidemia    Hypertension    Impaired fasting blood sugar 2018   Lipoma of back    since 2007 or earlier   Macular degeneration    Snoring 2018   sleep study declined due to cost   Wears glasses     Family History  Problem Relation Age of Onset   Diabetes Mother    Stroke Mother    Other Mother        brain tumor   Stroke Father    Diabetes Father    Heart disease Sister    Heart disease Brother    Cancer Neg Hx     Past Surgical History:  Procedure Laterality Date   COLONOSCOPY  06/2018   Mild pancolonic diverticulosis of the sigmoid colon, Dr. Jackquline Denmark;  2008 with Dr. Benson Norway   ganglion cyst removal     left wrist   NM MYOVIEW LTD  08/2009   Exercise capacity-9 METS.  EKG negative.  EF 62%.  No R WMA.  No ischemia or infarction.   NM MYOVIEW LTD  07/01/2021   Ex: 5 min - 7.0 METS. 87% MPH (of 137). Negative EKG GXT. NO ISCHEMIA OR INFARCTION   right lung surgery to remove mass Right 2007   Benign right lower lobe mass.   TONSILLECTOMY     TRANSTHORACIC ECHOCARDIOGRAM  05/2016   Normal LV size and function.  GR 1 DD.  EF 55 to 60%.  No  wall motion normality.  Normal valves.   TRANSTHORACIC ECHOCARDIOGRAM  06/10/2021   EF 55 to 60%.  Normal wall motion.  Normal relaxation.  Normal RV.  Normal valves.  Normal RA P/RVP.   Social History   Occupational History   Occupation: [employer]  Tobacco Use   Smoking status: Former    Packs/day: 1.00    Years: 30.00    Pack years: 30.00    Types: Cigarettes    Quit date: 01/06/2003    Years since quitting: 19.0   Smokeless  tobacco: Never  Vaping Use   Vaping Use: Never used  Substance and Sexual Activity   Alcohol use: No    Alcohol/week: 0.0 standard drinks   Drug use: No   Sexual activity: Not on file

## 2022-04-22 ENCOUNTER — Other Ambulatory Visit: Payer: Self-pay | Admitting: Medical

## 2022-05-10 ENCOUNTER — Encounter: Payer: Self-pay | Admitting: Internal Medicine

## 2022-05-18 ENCOUNTER — Other Ambulatory Visit: Payer: Self-pay | Admitting: Family Medicine

## 2022-05-18 ENCOUNTER — Other Ambulatory Visit: Payer: Self-pay | Admitting: Medical

## 2022-05-18 ENCOUNTER — Other Ambulatory Visit (HOSPITAL_COMMUNITY): Payer: Self-pay

## 2022-05-18 MED ORDER — ALBUTEROL SULFATE HFA 108 (90 BASE) MCG/ACT IN AERS
2.0000 | INHALATION_SPRAY | Freq: Four times a day (QID) | RESPIRATORY_TRACT | 0 refills | Status: AC | PRN
Start: 2022-05-18 — End: ?
  Filled 2022-05-18: qty 6.7, 25d supply, fill #0

## 2022-05-22 ENCOUNTER — Other Ambulatory Visit (HOSPITAL_COMMUNITY): Payer: Self-pay

## 2022-05-22 ENCOUNTER — Other Ambulatory Visit: Payer: Self-pay | Admitting: Medical

## 2022-05-22 ENCOUNTER — Encounter: Payer: 59 | Admitting: Medical

## 2022-05-29 ENCOUNTER — Other Ambulatory Visit (HOSPITAL_COMMUNITY): Payer: Self-pay

## 2022-06-13 ENCOUNTER — Other Ambulatory Visit: Payer: Self-pay | Admitting: Medical

## 2022-06-13 ENCOUNTER — Encounter: Payer: Self-pay | Admitting: Internal Medicine

## 2022-06-17 ENCOUNTER — Other Ambulatory Visit: Payer: Self-pay | Admitting: Cardiology

## 2022-07-14 ENCOUNTER — Other Ambulatory Visit: Payer: Self-pay | Admitting: Medical

## 2022-07-15 ENCOUNTER — Other Ambulatory Visit: Payer: Self-pay | Admitting: Cardiology

## 2022-07-17 ENCOUNTER — Ambulatory Visit (INDEPENDENT_AMBULATORY_CARE_PROVIDER_SITE_OTHER): Payer: 59 | Admitting: Medical

## 2022-07-17 VITALS — BP 122/70 | HR 68 | Ht 72.0 in | Wt 249.8 lb

## 2022-07-17 DIAGNOSIS — Z125 Encounter for screening for malignant neoplasm of prostate: Secondary | ICD-10-CM | POA: Diagnosis not present

## 2022-07-17 DIAGNOSIS — Z0000 Encounter for general adult medical examination without abnormal findings: Secondary | ICD-10-CM

## 2022-07-17 DIAGNOSIS — N529 Male erectile dysfunction, unspecified: Secondary | ICD-10-CM

## 2022-07-17 DIAGNOSIS — K219 Gastro-esophageal reflux disease without esophagitis: Secondary | ICD-10-CM

## 2022-07-17 DIAGNOSIS — Z23 Encounter for immunization: Secondary | ICD-10-CM | POA: Diagnosis not present

## 2022-07-17 DIAGNOSIS — N182 Chronic kidney disease, stage 2 (mild): Secondary | ICD-10-CM

## 2022-07-17 DIAGNOSIS — G4733 Obstructive sleep apnea (adult) (pediatric): Secondary | ICD-10-CM

## 2022-07-17 DIAGNOSIS — R7301 Impaired fasting glucose: Secondary | ICD-10-CM

## 2022-07-17 DIAGNOSIS — N400 Enlarged prostate without lower urinary tract symptoms: Secondary | ICD-10-CM

## 2022-07-17 DIAGNOSIS — I700 Atherosclerosis of aorta: Secondary | ICD-10-CM

## 2022-07-17 LAB — POCT URINALYSIS DIP (PROADVANTAGE DEVICE)
Bilirubin, UA: NEGATIVE
Blood, UA: NEGATIVE
Glucose, UA: NEGATIVE mg/dL
Ketones, POC UA: NEGATIVE mg/dL
Leukocytes, UA: NEGATIVE
Nitrite, UA: NEGATIVE
Protein Ur, POC: NEGATIVE mg/dL
Specific Gravity, Urine: 1.015
Urobilinogen, Ur: NEGATIVE
pH, UA: 6 (ref 5.0–8.0)

## 2022-07-17 NOTE — Progress Notes (Signed)
I have put order in for DME and sent message to [facility] to get pt set up

## 2022-07-17 NOTE — Progress Notes (Signed)
Subjective:    HPI  Dave Carpenter is a 66 y.o. male who presents for Chief Complaint  Patient presents with   fasting cpe    Fasting cpe, sometimes will get dizziness- been going on for the last 2 months. Stays sleepy a lot and if he sits he will fall asleep. Fighting fatigue for a long time    Patient Care Team: Seona Clemenson, Leward Quan as PCP - General (Family Medicine) Leonie Man, MD as PCP - Cardiology (Cardiology) Sees dentist Sees eye doctor Dr. Larey Seat, neurology Dr. Jackquline Denmark, GI Dr. Daneen Schick, cardiology Dr. Joni Fears, ortho   Concerns: Never heard back about sleep study last year.  He has extreme fatigue, been dealing with this for long time.  Water intake is okay symptoms  Exercise - not currently as he has been energy  Hypertension-compliant with medications.  No chest pain, no edema, no palpitations  Hyperlipidemia-compliant with Lipitor without complaint  He has ongoing issues with pains in his joints, her  GERD - uses omeprazole daily.   Reviewed their medical, surgical, family, social, medication, and allergy history and updated chart as appropriate.  Past Medical History:  Diagnosis Date   Allergy    Aortic atherosclerosis ([place]) 2017   Arthritis    knees, shouders, wrists   Birth mark    upper abdomen   Elevated uric acid in blood 2018   Erectile dysfunction    GERD (gastroesophageal reflux disease)    History of seizure    few times as a child, none as an adult   Hyperlipidemia    Hypertension    Impaired fasting blood sugar 2018   Lipoma of back    since 2007 or earlier   Macular degeneration    Snoring 2018   sleep study declined due to cost   Wears glasses     Past Surgical History:  Procedure Laterality Date   COLONOSCOPY  06/2018   Mild pancolonic diverticulosis of the sigmoid colon, Dr. Jackquline Denmark;  2008 with Dr. Benson [place]   ganglion cyst removal     left wrist   NM MYOVIEW LTD  08/2009   Exercise  capacity-9 METS.  EKG negative.  EF 62%.  No R WMA.  No ischemia or infarction.   NM MYOVIEW LTD  07/01/2021   Ex: 5 min - 7.0 METS. 87% MPH (of 137). Negative EKG GXT. NO ISCHEMIA OR INFARCTION   right lung surgery to remove mass Right 2007   Benign right lower lobe mass.   TONSILLECTOMY     TRANSTHORACIC ECHOCARDIOGRAM  05/2016   Normal LV size and function.  GR 1 DD.  EF 55 to 60%.  No wall motion normality.  Normal valves.   TRANSTHORACIC ECHOCARDIOGRAM  06/10/2021   EF 55 to 60%.  Normal wall motion.  Normal relaxation.  Normal RV.  Normal valves.  Normal RA P/RVP.    Family History  Problem Relation Age of Onset   Diabetes Mother    Stroke Mother    Other Mother        brain tumor   Stroke Father    Diabetes Father    Heart disease Sister    Heart disease Brother    Cancer Neg Hx      Current Outpatient Medications:    albuterol (VENTOLIN HFA) 108 (90 Base) MCG/ACT inhaler, Inhale 2 puffs into the lungs every 6 (six) hours as needed., Disp: 6.7 g, Rfl: 0   amLODipine (NORVASC) 5  MG tablet, TAKE 1 TABLET (5 MG TOTAL) BY MOUTH DAILY., Disp: 30 tablet, Rfl: 2   atorvastatin (LIPITOR) 80 MG tablet, TAKE 1 TABLET BY MOUTH EVERY DAY, Disp: 90 tablet, Rfl: 0   carvedilol (COREG) 6.25 MG tablet, TAKE  3.125 MG ( 1/2 TABLET OF 6.25 MG ) IN THE MORNING  AND 6.25 MG  IN THE EVENING, Disp: 135 tablet, Rfl: 3   gabapentin (NEURONTIN) 300 MG capsule, TAKE 1 CAPSULE BY MOUTH TWICE A DAY, Disp: 60 capsule, Rfl: 2   losartan (COZAAR) 100 MG tablet, Take 1 tablet (100 mg total) by mouth daily. Schedule an appointment for further refills, 1st attrmpt, Disp: 30 tablet, Rfl: 0   omeprazole (PRILOSEC) 40 MG capsule, TAKE 1 CAPSULE BY MOUTH EVERY DAY, Disp: 90 capsule, Rfl: 0   isosorbide mononitrate (IMDUR) 30 MG 24 hr tablet, Take 1 tablet (30 mg total) by mouth at bedtime., Disp: 90 tablet, Rfl: 2  Allergies  Allergen Reactions   Aspirin     Upset stomach, gets hives     Review of  Systems Constitutional: -fever, -chills, -sweats, -unexpected weight change, -decreased appetite, +fatigue Allergy: -sneezing, -itching, -congestion Dermatology: -changing moles, --rash, -lumps ENT: -runny nose, -ear pain, -sore throat, -hoarseness, -sinus pain, -teeth pain, - ringing in ears, -hearing loss, -nosebleeds Cardiology: -chest pain, -palpitations, -swelling, -difficulty breathing when lying flat, -waking up short of breath Respiratory: -cough, -shortness of breath, -difficulty breathing with exercise or exertion, -wheezing, -coughing up blood Gastroenterology: -abdominal pain, -nausea, -vomiting, -diarrhea, -constipation, -blood in stool, -changes in bowel movement, -difficulty swallowing or eating Hematology: -bleeding, -bruising  Musculoskeletal:  -joint aches, -muscle aches, -joint swelling, -back pain, -neck pain, -cramping, -changes in gait Ophthalmology: denies vision changes, eye redness, itching, discharge Urology: -burning with urination, -difficulty urinating, -blood in urine, -urinary frequency, -urgency, -incontinence Neurology: -headache, -weakness, -tingling, -numbness, -memory loss, -falls, -dizziness Psychology: -depressed mood, -agitation, +sleep problems Male GU: no testicular mass, pain, no lymph nodes swollen, no swelling, no rash.       Objective:  BP 122/70   Pulse 68   Ht 6' (1.829 m)   Wt 249 lb 12.8 oz (113.3 kg)   BMI 33.88 kg/m    Wt Readings from Last 3 Encounters:  07/17/22 249 lb 12.8 oz (113.3 kg)  11/15/21 256 lb (116.1 kg)  07/08/21 249 lb 6.4 oz (113.1 kg)    General appearance: alert, no distress, WD/WN, African American male Skin: unremarkable, right upper abdomen with brown flat birth mark, approx 8cm x 5cm, scattered macules, large spongy lipoma right mid to low back approx 10-11 cm diam, unchanged, no other worrisome lesions HEENT: normocephalic, conjunctiva/corneas normal, sclerae anicteric, PERRLA, EOMi Neck: supple, no  lymphadenopathy, no thyromegaly, no masses, normal ROM, no bruits Chest: non tender, normal shape and expansion Heart: RRR, normal S1, S2, no murmurs Lungs: CTA bilaterally, no wheezes, rhonchi, or rales Abdomen: +bs, soft, non tender, non distended, no masses, no hepatomegaly, no splenomegaly, no bruits Back: non tender, normal ROM, no scoliosis Musculoskeletal: Hands and wrists with no obvious deformity, nontender to palpation, normal range of motion, no swelling, otherwise upper extremities non tender, no obvious deformity, normal ROM throughout, lower extremities non tender, no obvious deformity, normal ROM throughout Extremities: no edema, no cyanosis, no clubbing Pulses: 2+ symmetric, upper and lower extremities, normal cap refill Neurological:  alert, oriented x 3, CN2-12 intact, strength normal upper extremities and lower extremities, sensation normal throughout, DTRs 2+ throughout, no cerebellar signs, gait normal Psychiatric: normal  affect, behavior normal, pleasant  GU: normal GU, circ, no mass, no hernia, no lymphadenopathy Rectal: deferred    Assessment and Plan :   Encounter Diagnoses  Name Primary?   Routine general medical examination at a health care facility Yes   Severe obstructive sleep apnea    CKD (chronic kidney disease) stage 2, GFR 60-89 ml/min    Screening for prostate cancer    Aortic atherosclerosis ([facility])    Gastroesophageal reflux disease without esophagitis    Impaired fasting blood sugar    Benign prostatic hyperplasia without lower urinary tract symptoms    Erectile dysfunction, unspecified erectile dysfunction type    Need for influenza vaccination    Need for COVID-19 vaccine     Physical exam - discussed and counseled on healthy lifestyle, diet, exercise, preventative care, vaccinations, sick and well care, proper use of emergency dept and after hours care, and addressed their concerns.    Health screening: See your eye doctor yearly for routine  vision care. See your dentist yearly for routine dental care including hygiene visits twice yearly.   Cancer screening: Colonoscopy - reviewed 2019 colonoscopy report  Discussed PSA, prostate exam, and prostate cancer screening risks/benefits.   Updated PSA lab today.    I recommend regular skin surveillance to watch for changing moles or new skin lesions   Vaccinations: Counseled on the Covid virus vaccine.  Vaccine information sheet given.  Covid vaccine given after consent obtained.  Counseled on the influenza virus vaccine.  Vaccine information sheet given.  Influenza vaccine given after consent obtained.  I recommend shingles and Prevnar 20 vaccines as well   Separate significant chronic issues discussed: OSA -we discussed the sleep study results from November 2022 study.  I am not sure what happened but those results fell through the cracks and he never heard back last year.  I advised anytime in the future if there is result he is waiting on, and he has not heard back within a week to give [place] a call.  We certainly did not mean for this moment about without treatment.  Discussed treatment options, need for weight loss.  Referral for CPAP  Hypertension-continue amlodipine 5 mg daily, carvedilol 6.25 mg twice daily, losartan 100 mg daily  Hyperlipidemia, aortic atherosclerosis -continue Lipitor '80mg'$  daily.  Eat a low-cholesterol diet  GERD-continue omeprazole as needed.  Long-term use of omeprazole can lower your bone density as you get older and can change his absorption of certain nutrients.  Impaired fasting glucose, prediabetes -labs today.    Screen for prostate cancer, Enlarged prostate-routine PSA lab today  CKD 2-chronic kidney disease stage II mild. I strongly recommend hydrating with 80 to 100 ounces of water daily Due to abnormal kidney function, and in order to protect your kidneys, I recommend you avoid medications that can harm the kidneys such as ibuprofen,  Aleve, Advil, Motrin, Naprosyn, or prescription anti-inflammatories which are used for pain, inflammation, and arthritis.   You should avoid dehydration which can harm the kidneys. lets continue to work to keep blood sugar and blood pressures under control  Diante was seen today for fasting cpe.  Diagnoses and all orders for this visit:  Routine general medical examination at a health care facility -     Comprehensive metabolic panel -     CBC with Differential/Platelet -     PSA -     Lipid panel -     Cancel: PSA -     POCT Urinalysis DIP (  Proadvantage Device) -     Hemoglobin A1c  Severe obstructive sleep apnea -     For home use only DME continuous positive airway pressure (CPAP)  CKD (chronic kidney disease) stage 2, GFR 60-89 ml/min  Screening for prostate cancer -     PSA  Aortic atherosclerosis (HCC) -     Lipid panel  Gastroesophageal reflux disease without esophagitis  Impaired fasting blood sugar -     Hemoglobin A1c  Benign prostatic hyperplasia without lower urinary tract symptoms -     PSA  Erectile dysfunction, unspecified erectile dysfunction type  Need for influenza vaccination  Need for COVID-19 vaccine    Follow-up pending labs, yearly for physical

## 2022-07-18 ENCOUNTER — Other Ambulatory Visit: Payer: Self-pay | Admitting: Medical

## 2022-07-18 DIAGNOSIS — R7989 Other specified abnormal findings of blood chemistry: Secondary | ICD-10-CM

## 2022-07-18 DIAGNOSIS — I10 Essential (primary) hypertension: Secondary | ICD-10-CM

## 2022-07-18 DIAGNOSIS — E7849 Other hyperlipidemia: Secondary | ICD-10-CM

## 2022-07-18 LAB — CBC WITH DIFFERENTIAL/PLATELET
Basophils Absolute: 0 10*3/uL (ref 0.0–0.2)
Basos: 1 %
EOS (ABSOLUTE): 0.1 10*3/uL (ref 0.0–0.4)
Eos: 2 %
Hematocrit: 40.9 % (ref 37.5–51.0)
Hemoglobin: 14.1 g/dL (ref 13.0–17.7)
Immature Grans (Abs): 0 10*3/uL (ref 0.0–0.1)
Immature Granulocytes: 1 %
Lymphocytes Absolute: 2.1 10*3/uL (ref 0.7–3.1)
Lymphs: 34 %
MCH: 31.2 pg (ref 26.6–33.0)
MCHC: 34.5 g/dL (ref 31.5–35.7)
MCV: 91 fL (ref 79–97)
Monocytes Absolute: 0.6 10*3/uL (ref 0.1–0.9)
Monocytes: 10 %
Neutrophils Absolute: 3.3 10*3/uL (ref 1.4–7.0)
Neutrophils: 52 %
Platelets: 261 10*3/uL (ref 150–450)
RBC: 4.52 x10E6/uL (ref 4.14–5.80)
RDW: 13.2 % (ref 11.6–15.4)
WBC: 6.2 10*3/uL (ref 3.4–10.8)

## 2022-07-18 LAB — LIPID PANEL
Chol/HDL Ratio: 5.6 ratio — ABNORMAL HIGH (ref 0.0–5.0)
Cholesterol, Total: 185 mg/dL (ref 100–199)
HDL: 33 mg/dL — ABNORMAL LOW (ref >39)
LDL Chol Calc (NIH): 125 mg/dL — ABNORMAL HIGH (ref 0–99)
Triglycerides: 150 mg/dL — ABNORMAL HIGH (ref 0–149)
VLDL Cholesterol Cal: 27 mg/dL (ref 5–40)

## 2022-07-18 LAB — COMPREHENSIVE METABOLIC PANEL
ALT: 21 IU/L (ref 0–44)
AST: 20 IU/L (ref 0–40)
Albumin/Globulin Ratio: 1.5 (ref 1.2–2.2)
Albumin: 4.5 g/dL (ref 3.9–4.9)
Alkaline Phosphatase: 99 IU/L (ref 44–121)
BUN/Creatinine Ratio: 8 — ABNORMAL LOW (ref 10–24)
BUN: 14 mg/dL (ref 8–27)
Bilirubin Total: 0.5 mg/dL (ref 0.0–1.2)
CO2: 22 mmol/L (ref 20–29)
Calcium: 9.5 mg/dL (ref 8.6–10.2)
Chloride: 106 mmol/L (ref 96–106)
Creatinine, Ser: 1.69 mg/dL — ABNORMAL HIGH (ref 0.76–1.27)
Globulin, Total: 3 g/dL (ref 1.5–4.5)
Glucose: 78 mg/dL (ref 70–99)
Potassium: 3.5 mmol/L (ref 3.5–5.2)
Sodium: 144 mmol/L (ref 134–144)
Total Protein: 7.5 g/dL (ref 6.0–8.5)
eGFR: 44 mL/min/{1.73_m2} — ABNORMAL LOW (ref >59)

## 2022-07-18 LAB — PSA: Prostate Specific Ag, Serum: 0.8 ng/mL (ref 0.0–4.0)

## 2022-07-18 LAB — HEMOGLOBIN A1C
Est. average glucose Bld gHb Est-mCnc: 134 mg/dL
Hgb A1c MFr Bld: 6.3 % — ABNORMAL HIGH (ref 4.8–5.6)

## 2022-08-10 ENCOUNTER — Other Ambulatory Visit: Payer: Self-pay | Admitting: Medical

## 2022-09-08 ENCOUNTER — Other Ambulatory Visit: Payer: Self-pay | Admitting: Medical

## 2022-10-07 ENCOUNTER — Other Ambulatory Visit: Payer: Self-pay | Admitting: Medical

## 2023-01-03 ENCOUNTER — Other Ambulatory Visit: Payer: Self-pay | Admitting: Medical

## 2023-01-03 MED ORDER — CARVEDILOL 6.25 MG PO TABS: ORAL_TABLET | ORAL | 1 refills | Status: DC

## 2023-01-03 NOTE — Addendum Note (Signed)
Addended by: Herminio Commons A on: 01/03/2023 09:52 AM   Modules accepted: Orders

## 2023-01-06 ENCOUNTER — Other Ambulatory Visit: Payer: Self-pay | Admitting: Cardiology

## 2023-02-03 ENCOUNTER — Other Ambulatory Visit: Payer: Self-pay | Admitting: Medical

## 2023-03-01 ENCOUNTER — Other Ambulatory Visit: Payer: Self-pay | Admitting: Medical

## 2023-03-26 ENCOUNTER — Ambulatory Visit (INDEPENDENT_AMBULATORY_CARE_PROVIDER_SITE_OTHER): Payer: Managed Care, Other (non HMO) | Admitting: Medical

## 2023-03-26 VITALS — BP 130/84 | HR 94 | Temp 97.9°F | Wt 251.6 lb

## 2023-03-26 DIAGNOSIS — M5441 Lumbago with sciatica, right side: Secondary | ICD-10-CM | POA: Diagnosis not present

## 2023-03-26 DIAGNOSIS — M62838 Other muscle spasm: Secondary | ICD-10-CM | POA: Diagnosis not present

## 2023-03-26 DIAGNOSIS — D171 Benign lipomatous neoplasm of skin and subcutaneous tissue of trunk: Secondary | ICD-10-CM

## 2023-03-26 MED ORDER — PREDNISONE 10 MG PO TABS: ORAL_TABLET | ORAL | 0 refills | Status: DC

## 2023-03-26 MED ORDER — TIZANIDINE HCL 4 MG PO TABS: 4.0000 mg | ORAL_TABLET | Freq: Two times a day (BID) | ORAL | 0 refills | Status: DC | PRN

## 2023-03-26 MED ORDER — OXYCODONE-ACETAMINOPHEN 5-325 MG PO TABS: 1.0000 | ORAL_TABLET | ORAL | 0 refills | Status: AC | PRN

## 2023-03-26 NOTE — Progress Notes (Signed)
Subjective:  Dave Carpenter is a 67 y.o. male who presents for Chief Complaint  Patient presents with   Back Pain    Back pain x 1 week. Hurts to walk. Pain on right side, it will hurt if he has to left right leg to step on something     Here for back pain x 1 week.  No recently injury fall or trauma.  Pain in lower right back.  When trying to step, hurts to walk.   Right leg is feeling somewhat weak.  No fever, no incontinence.   No numbness in genitals or rectum.   Using some tylenol extra strength.  Used some muscle rub as well. No other aggravating or relieving factors.    No other c/o.   Past Medical History:  Diagnosis Date   Allergy    Aortic atherosclerosis (HCC) 2017   Arthritis    knees, shouders, wrists   Birth mark    upper abdomen   Elevated uric acid in blood 2018   Erectile dysfunction    GERD (gastroesophageal reflux disease)    History of seizure    few times as a child, none as an adult   Hyperlipidemia    Hypertension    Impaired fasting blood sugar 2018   Lipoma of back    since 2007 or earlier   Macular degeneration    Snoring 2018   sleep study declined due to cost   Wears glasses    Past Surgical History:  Procedure Laterality Date   COLONOSCOPY  06/2018   Mild pancolonic diverticulosis of the sigmoid colon, Dr. Lynann Bologna;  2008 with Dr. Elnoria Howard   ganglion cyst removal     left wrist   NM MYOVIEW LTD  08/2009   Exercise capacity-9 METS.  EKG negative.  EF 62%.  No R WMA.  No ischemia or infarction.   NM MYOVIEW LTD  07/01/2021   Ex: 5 min - 7.0 METS. 87% MPH (of 137). Negative EKG GXT. NO ISCHEMIA OR INFARCTION   right lung surgery to remove mass Right 2007   Benign right lower lobe mass.   TONSILLECTOMY     TRANSTHORACIC ECHOCARDIOGRAM  05/2016   Normal LV size and function.  GR 1 DD.  EF 55 to 60%.  No wall motion normality.  Normal valves.   TRANSTHORACIC ECHOCARDIOGRAM  06/10/2021   EF 55 to 60%.  Normal wall motion.  Normal relaxation.   Normal RV.  Normal valves.  Normal RA P/RVP.    The following portions of the patient's history were reviewed and updated as appropriate: allergies, current medications, past family history, past medical history, past social history, past surgical history and problem list.  ROS Otherwise as in subjective above    Objective: BP 130/84   Pulse 94   Temp 97.9 F (36.6 C)   Wt 251 lb 9.6 oz (114.1 kg)   BMI 34.12 kg/m   General appearance: alert, no distress, well developed, well nourished Tender right lumbar paraspinal region, ROM limited, large lipoma soft tissue mass in mid to low back midline Right leg nontender, mild pain with hip external ROM, otherwise legs unremarkable Pulses of legs normal Strength 4-5/5 on right with knee flexion and extension, but foot strength normal, DTR blunted.  Left leg neuro unremarkable Skin: no rash    Assessment: Encounter Diagnoses  Name Primary?   Right-sided low back pain with right-sided sciatica, unspecified chronicity Yes   Muscle spasm    Lipoma of  torso      Plan: Sciatica, back pain, spasm - we discussed  symptoms, findings, recommendation and medications below.    Lipoma - referral to general surgery    Patient Instructions  Recommendations: Rest Use some stretching this week No heavy lifting, climbing, or crawling this week Begin tizanidine muscle relaxer, 1 tablet up to twice daily this week for spasm and tension in the back.  Caution with sedation Begin Percocet pain medication if needed for worse pain, up to twice daily, but separate from muscle relaxer by at least 3-4 hours Continue muscle rub You can use heat or cold therapy alternating If no improvement within 48 hours, add the prednisone steroid dose pack If no impairment within 7-14 days, let me know as we may refer to therapy or recheck   Carsten was seen today for back pain.  Diagnoses and all orders for this visit:  Right-sided low back pain with  right-sided sciatica, unspecified chronicity  Muscle spasm  Lipoma of torso -     Ambulatory referral to General Surgery  Other orders -     tiZANidine (ZANAFLEX) 4 MG tablet; Take 1 tablet (4 mg total) by mouth 2 (two) times daily as needed for muscle spasms. -     oxyCODONE-acetaminophen (PERCOCET/ROXICET) 5-325 MG tablet; Take 1 tablet by mouth every 4 (four) hours as needed for up to 5 days for severe pain. -     predniSONE (DELTASONE) 10 MG tablet; 6 tablets all together day 1, 5 tablets day 2, 4 tablets day 3, 3 tablets day 4, 2 tablets day 5, 1 tablet day 6.    Follow up: pending call back, referral

## 2023-03-26 NOTE — Patient Instructions (Signed)
Recommendations: Rest Use some stretching this week No heavy lifting, climbing, or crawling this week Begin tizanidine muscle relaxer, 1 tablet up to twice daily this week for spasm and tension in the back.  Caution with sedation Begin Percocet pain medication if needed for worse pain, up to twice daily, but separate from muscle relaxer by at least 3-4 hours Continue muscle rub You can use heat or cold therapy alternating If no improvement within 48 hours, add the prednisone steroid dose pack If no impairment within 7-14 days, let me know as we may refer to therapy or recheck

## 2023-04-20 ENCOUNTER — Ambulatory Visit: Payer: Self-pay | Admitting: Surgery

## 2023-04-20 NOTE — H&P (Signed)
History of Present Illness: Dave Carpenter is a 67 y.o. male who was referred to me for evaluation of a lipoma on the back. He first noticed a small lump on the back about 9 years ago. It was initially small at the time, but has been slowly growing and is now bothering him. It causes him discomfort when he tries to sit back in a chair. He would like to discuss removal.     He does not take any blood thinners. He had a cardiac workup a few years ago for chest pain. Echo was normal, and a nuclear stress test in Oct 2022 was low risk with no evidence of ischemia.     Review of Systems: A complete review of systems was obtained from the patient.  I have reviewed this information and discussed as appropriate with the patient.  See HPI as well for other ROS.     Medical History: Past Medical History Past Medical History: Diagnosis Date  Arthritis    Hypertension         Problem List There is no problem list on file for this patient.     Past Surgical History Past Surgical History: Procedure Laterality Date  lung growth removed   03/2004      Allergies Allergies Allergen Reactions  Aspirin Unknown     Upset stomach, gets hives      Medications Ordered Prior to Encounter Current Outpatient Medications on File Prior to Visit Medication Sig Dispense Refill  amLODIPine (NORVASC) 5 MG tablet Take 5 mg by mouth once daily      atorvastatin (LIPITOR) 80 MG tablet Take 80 mg by mouth once daily      carvediloL (COREG) 6.25 MG tablet TAKE 1/2 TABLET BY MOUTH IN THE MORNING AND 1 TABLET EVERY EVENING      losartan (COZAAR) 100 MG tablet TAKE 1 TABLET (100 MG TOTAL) BY MOUTH DAILY. SCHEDULE AN APPOINTMENT FOR FURTHER REFILLS      omeprazole (PRILOSEC) 40 MG DR capsule Take 40 mg by mouth once daily        No current facility-administered medications on file prior to visit.      Family History Family History Problem Relation Age of Onset  High blood pressure (Hypertension) Mother     Hyperlipidemia (Elevated cholesterol) Mother    Deep vein thrombosis (DVT or abnormal blood clot formation) Mother    Diabetes Mother    High blood pressure (Hypertension) Father    Hyperlipidemia (Elevated cholesterol) Father    Diabetes Father    Deep vein thrombosis (DVT or abnormal blood clot formation) Father        Tobacco Use History Social History    Tobacco Use Smoking Status Former  Types: Cigarettes  Start date: 2005 Smokeless Tobacco Never      Social History Social History    Socioeconomic History  Marital status: Married Tobacco Use  Smoking status: Former     Types: Cigarettes     Start date: 2005  Smokeless tobacco: Never Substance and Sexual Activity  Alcohol use: Never  Drug use: Never      Objective:     Vitals:   04/20/23 1043 04/20/23 1047 BP: 128/88   Pulse: 92   Temp: 36.6 C (97.8 F)   SpO2: 98%   Weight: (!) 115.3 kg (254 lb 3.2 oz)   Height: 182.9 cm (6')   PainSc:   0-No pain   Body mass index is 34.48 kg/m.   Physical Exam Vitals reviewed.  Constitutional:      General: He is not in acute distress.    Appearance: Normal appearance.  HENT:     Head: Normocephalic and atraumatic.  Eyes:     General: No scleral icterus.    Conjunctiva/sclera: Conjunctivae normal.  Pulmonary:     Effort: Pulmonary effort is normal. No respiratory distress.  Musculoskeletal:        General: Normal range of motion.  Skin:    General: Skin is warm and dry.     Comments: Subcutaneous, well-circumscribed, relatively mobile mass overlying the spine in the mid-back, approximately 10cm in diameter.  Neurological:     General: No focal deficit present.     Mental Status: He is alert and oriented to person, place, and time.            Assessment and Plan: Diagnoses and all orders for this visit:   Lipoma of back -     CCS Case Posting Request; Future     This is a 67 yo male with a slowly enlarging and now symptomatic mass on the  back. History and exam are consistent with a benign lipoma. I reviewed the details of surgical excision, including the possibility of drain placement to prevent seroma given the fairly large size. The patient expressed understanding and agrees to proceed with surgery. He would like to schedule surgery in November when he has time off. He will be contacted to schedule an elective surgery date.  Sophronia Simas, MD [facility] General, Hepatobiliary and Pancreatic Surgery 04/20/23 11:11 AM

## 2023-05-17 ENCOUNTER — Other Ambulatory Visit: Payer: Self-pay | Admitting: Medical

## 2023-05-17 NOTE — Telephone Encounter (Signed)
Has an appt in November 

## 2023-06-21 ENCOUNTER — Other Ambulatory Visit: Payer: Self-pay | Admitting: Cardiology

## 2023-07-15 ENCOUNTER — Other Ambulatory Visit: Payer: Self-pay | Admitting: Medical

## 2023-07-23 ENCOUNTER — Ambulatory Visit: Payer: Managed Care, Other (non HMO) | Admitting: Medical

## 2023-07-23 ENCOUNTER — Encounter: Payer: Self-pay | Admitting: Medical

## 2023-07-23 VITALS — BP 122/80 | HR 87 | Ht 72.0 in | Wt 254.6 lb

## 2023-07-23 DIAGNOSIS — G479 Sleep disorder, unspecified: Secondary | ICD-10-CM | POA: Diagnosis not present

## 2023-07-23 DIAGNOSIS — N182 Chronic kidney disease, stage 2 (mild): Secondary | ICD-10-CM | POA: Diagnosis not present

## 2023-07-23 DIAGNOSIS — I700 Atherosclerosis of aorta: Secondary | ICD-10-CM

## 2023-07-23 DIAGNOSIS — G4733 Obstructive sleep apnea (adult) (pediatric): Secondary | ICD-10-CM

## 2023-07-23 DIAGNOSIS — Z0000 Encounter for general adult medical examination without abnormal findings: Secondary | ICD-10-CM

## 2023-07-23 DIAGNOSIS — G629 Polyneuropathy, unspecified: Secondary | ICD-10-CM

## 2023-07-23 DIAGNOSIS — Z23 Encounter for immunization: Secondary | ICD-10-CM | POA: Diagnosis not present

## 2023-07-23 DIAGNOSIS — Z7185 Encounter for immunization safety counseling: Secondary | ICD-10-CM | POA: Diagnosis not present

## 2023-07-23 DIAGNOSIS — N400 Enlarged prostate without lower urinary tract symptoms: Secondary | ICD-10-CM

## 2023-07-23 DIAGNOSIS — I10 Essential (primary) hypertension: Secondary | ICD-10-CM

## 2023-07-23 DIAGNOSIS — R7301 Impaired fasting glucose: Secondary | ICD-10-CM

## 2023-07-23 DIAGNOSIS — Z125 Encounter for screening for malignant neoplasm of prostate: Secondary | ICD-10-CM

## 2023-07-23 DIAGNOSIS — E7849 Other hyperlipidemia: Secondary | ICD-10-CM

## 2023-07-23 DIAGNOSIS — R0683 Snoring: Secondary | ICD-10-CM

## 2023-07-23 LAB — POCT URINALYSIS DIP (PROADVANTAGE DEVICE)
Bilirubin, UA: NEGATIVE
Blood, UA: NEGATIVE
Glucose, UA: NEGATIVE mg/dL
Ketones, POC UA: NEGATIVE mg/dL
Leukocytes, UA: NEGATIVE
Nitrite, UA: NEGATIVE
Protein Ur, POC: NEGATIVE mg/dL
Specific Gravity, Urine: 1.01
Urobilinogen, Ur: NEGATIVE
pH, UA: 6 (ref 5.0–8.0)

## 2023-07-23 NOTE — Progress Notes (Signed)
Subjective:    HPI  Dave Carpenter is a 67 y.o. male who presents for Chief Complaint  Patient presents with   Annual Exam    Fasting cpe, no concerns, would like covid and flu but decline shingles and pneumonia    Patient Care Team: Jaicey Sweaney, Cleda Mccreedy as PCP - General (Family Medicine) Marykay Lex, MD as PCP - Cardiology (Cardiology) Sees dentist Sees eye doctor Dr. Melvyn Novas, neurology Dr. Lynann Bologna, GI Dr. Verdis Prime, cardiology Dr. Norlene Campbell, ortho   Concerns: Hypertension-compliant with medications.  No chest pain, no edema, no palpitations  Hyperlipidemia-compliant with Lipitor without complaint  GERD - uses omeprazole daily.  For several weeks been having headaches.  Typically starts from the left eye the nose up to the other.  Headaches can last for few hours in the morning.  Or if they start in the afternoon it in the last till nighttime where he goes to sleep.  No visual changes, no numbness ting or weakness that is new, no confusion.  He does have a history of OSA without CPAP as he could not afford it in the past.  Still has snoring, wakes up several times at night.  Reviewed their medical, surgical, family, social, medication, and allergy history and updated chart as appropriate.  Past Medical History:  Diagnosis Date   Allergy    Aortic atherosclerosis (HCC) 2017   Arthritis    knees, shouders, wrists   Birth mark    upper abdomen   Elevated uric acid in blood 2018   Erectile dysfunction    GERD (gastroesophageal reflux disease)    History of seizure    few times as a child, none as an adult   Hyperlipidemia    Hypertension    Impaired fasting blood sugar 2018   Lipoma of back    since 2007 or earlier   Macular degeneration    Snoring 2018   sleep study declined due to cost   Wears glasses     Past Surgical History:  Procedure Laterality Date   COLONOSCOPY  06/2018   Mild pancolonic diverticulosis of the sigmoid colon,  Dr. Lynann Bologna;  2008 with Dr. Elnoria Howard   ganglion cyst removal     left wrist   NM MYOVIEW LTD  08/2009   Exercise capacity-9 METS.  EKG negative.  EF 62%.  No R WMA.  No ischemia or infarction.   NM MYOVIEW LTD  07/01/2021   Ex: 5 min - 7.0 METS. 87% MPH (of 137). Negative EKG GXT. NO ISCHEMIA OR INFARCTION   right lung surgery to remove mass Right 2007   Benign right lower lobe mass.   TONSILLECTOMY     TRANSTHORACIC ECHOCARDIOGRAM  05/2016   Normal LV size and function.  GR 1 DD.  EF 55 to 60%.  No wall motion normality.  Normal valves.   TRANSTHORACIC ECHOCARDIOGRAM  06/10/2021   EF 55 to 60%.  Normal wall motion.  Normal relaxation.  Normal RV.  Normal valves.  Normal RA P/RVP.    Family History  Problem Relation Age of Onset   Diabetes Mother    Stroke Mother    Other Mother        brain tumor   Stroke Father    Diabetes Father    Heart disease Sister    Heart disease Brother    Cancer Neg Hx      Current Outpatient Medications:    albuterol (VENTOLIN HFA) 108 (90  Base) MCG/ACT inhaler, Inhale 2 puffs into the lungs every 6 (six) hours as needed., Disp: 6.7 g, Rfl: 0   amLODipine (NORVASC) 5 MG tablet, TAKE 1 TABLET (5 MG TOTAL) BY MOUTH DAILY., Disp: 90 tablet, Rfl: 0   atorvastatin (LIPITOR) 80 MG tablet, TAKE 1 TABLET BY MOUTH EVERY DAY, Disp: 30 tablet, Rfl: 5   carvedilol (COREG) 6.25 MG tablet, TAKE 1/2 TABLET BY MOUTH IN THE MORNING AND 1 TABLET EVERY EVENING, Disp: 45 tablet, Rfl: 0   gabapentin (NEURONTIN) 300 MG capsule, TAKE 1 CAPSULE BY MOUTH TWICE A DAY, Disp: 60 capsule, Rfl: 2   losartan (COZAAR) 100 MG tablet, TAKE 1 TABLET (100 MG TOTAL) BY MOUTH DAILY. SCHEDULE AN APPOINTMENT FOR FURTHER REFILLS, Disp: 30 tablet, Rfl: 5   omeprazole (PRILOSEC) 40 MG capsule, TAKE 1 CAPSULE BY MOUTH EVERY DAY, Disp: 30 capsule, Rfl: 5   isosorbide mononitrate (IMDUR) 30 MG 24 hr tablet, Take 1 tablet (30 mg total) by mouth at bedtime., Disp: 90 tablet, Rfl: 2  Allergies   Allergen Reactions   Aspirin     Upset stomach, gets hives    Review of Systems  Constitutional:  Negative for chills, fever, malaise/fatigue and weight loss.  HENT:  Negative for congestion, ear pain, hearing loss, sore throat and tinnitus.   Eyes:  Negative for blurred vision, pain and redness.  Respiratory:  Negative for cough, hemoptysis and shortness of breath.   Cardiovascular:  Negative for chest pain, palpitations, orthopnea, claudication and leg swelling.  Gastrointestinal:  Negative for abdominal pain, blood in stool, constipation, diarrhea, nausea and vomiting.  Genitourinary:  Negative for dysuria, flank pain, frequency, hematuria and urgency.  Musculoskeletal:  Negative for falls, joint pain and myalgias.  Skin:  Negative for itching and rash.  Neurological:  Positive for headaches. Negative for dizziness, tingling, speech change and weakness.  Endo/Heme/Allergies:  Negative for polydipsia. Does not bruise/bleed easily.  Psychiatric/Behavioral:  Negative for depression and memory loss. The patient is not nervous/anxious and does not have insomnia.         Objective:  BP 122/80   Pulse 87   Ht 6' (1.829 m)   Wt 254 lb 9.6 oz (115.5 kg)   BMI 34.53 kg/m    Wt Readings from Last 3 Encounters:  07/23/23 254 lb 9.6 oz (115.5 kg)  03/26/23 251 lb 9.6 oz (114.1 kg)  07/17/22 249 lb 12.8 oz (113.3 kg)    General appearance: alert, no distress, WD/WN, African American male Skin: unremarkable, right upper abdomen with brown flat birth mark, approx 8cm x 5cm, scattered macules, large spongy lipoma right mid to low back approx 10-11 cm diam, unchanged, no other worrisome lesions HEENT: normocephalic, conjunctiva/corneas normal, sclerae anicteric, PERRLA, EOMi Neck: supple, no lymphadenopathy, no thyromegaly, no masses, normal ROM, no bruits Chest: non tender, normal shape and expansion Heart: RRR, normal S1, S2, no murmurs Lungs: CTA bilaterally, no wheezes, rhonchi, or  rales Abdomen: +bs, soft, non tender, non distended, no masses, no hepatomegaly, no splenomegaly, no bruits Back: non tender, normal ROM, no scoliosis Musculoskeletal: Hands and wrists with no obvious deformity, nontender to palpation, normal range of motion, no swelling, otherwise upper extremities non tender, no obvious deformity, normal ROM throughout, lower extremities non tender, no obvious deformity, normal ROM throughout Extremities: no edema, no cyanosis, no clubbing Pulses: 2+ symmetric, upper and lower extremities, normal cap refill Neurological:  alert, oriented x 3, CN2-12 intact, strength normal upper extremities and lower extremities, sensation normal throughout,  DTRs 2+ throughout, no cerebellar signs, gait normal Psychiatric: normal affect, behavior normal, pleasant  GU: declines Rectal: declines    Assessment and Plan :   Encounter Diagnoses  Name Primary?   Routine general medical examination at a health care facility Yes   Needs flu shot    COVID-19 vaccine administered    Vaccine counseling    Snoring    Severe obstructive sleep apnea    Screening for prostate cancer    Sleep disturbance    Neuropathy    CKD (chronic kidney disease) stage 2, GFR 60-89 ml/min    Essential hypertension    Impaired fasting blood sugar    Hyperlipidemia due to dietary fat intake    Aortic atherosclerosis (HCC)    Benign prostatic hyperplasia without lower urinary tract symptoms     Physical exam - discussed and counseled on healthy lifestyle, diet, exercise, preventative care, vaccinations, sick and well care, proper use of emergency dept and after hours care, and addressed their concerns.    Health screening: See your eye doctor yearly for routine vision care. See your dentist yearly for routine dental care including hygiene visits twice yearly.   Cancer screening: Colonoscopy - reviewed 2019 colonoscopy report  Discussed PSA, prostate exam, and prostate cancer screening  risks/benefits.   Updated PSA lab today.    I recommend regular skin surveillance to watch for changing moles or new skin lesions   Vaccinations: Counseled on the Covid virus vaccine.  Vaccine information sheet given.  Covid vaccine given after consent obtained.  Counseled on the influenza virus vaccine.  Vaccine information sheet given.  Influenza vaccine given after consent obtained.  I recommend shingles vaccine    Separate significant chronic issues discussed: Headaches - discussed possible cause.   Possibly related to OSA  OSA -we discussed the sleep study results from November 2022 study.  CPAP too expensive prior.  Discussed other treatment options.  Referral again for CPAP  Hypertension-continue amlodipine 5 mg daily, carvedilol 6.25 mg twice daily, losartan 100 mg daily  Hyperlipidemia, aortic atherosclerosis -continue Lipitor 80mg  daily.  Eat a low-cholesterol diet  GERD-continue omeprazole as needed.  Long-term use of omeprazole can lower your bone density as you get older and can change his absorption of certain nutrients.  Impaired fasting glucose, prediabetes -labs today.    Screen for prostate cancer, Enlarged prostate-routine PSA lab today  CKD 2-chronic kidney disease stage II mild. I strongly recommend hydrating with 80 to 100 ounces of water daily Due to abnormal kidney function, and in order to protect your kidneys, I recommend you avoid medications that can harm the kidneys such as ibuprofen, Aleve, Advil, Motrin, Naprosyn, or prescription anti-inflammatories which are used for pain, inflammation, and arthritis.   You should avoid dehydration which can harm the kidneys. lets continue to work to keep blood sugar and blood pressures under control  Mickie was seen today for annual exam.  Diagnoses and all orders for this visit:  Routine general medical examination at a health care facility -     Comprehensive metabolic panel -     CBC -     Lipid panel -      PSA -     Hemoglobin A1c -     POCT Urinalysis DIP (Proadvantage Device)  Needs flu shot -     Flu Vaccine Trivalent High Dose (Fluad)  COVID-19 vaccine administered -     Pfizer Comirnaty Covid -19 Vaccine 44yrs and older  Vaccine counseling  Snoring  Severe obstructive sleep apnea  Screening for prostate cancer -     PSA  Sleep disturbance  Neuropathy  CKD (chronic kidney disease) stage 2, GFR 60-89 ml/min  Essential hypertension  Impaired fasting blood sugar -     Hemoglobin A1c  Hyperlipidemia due to dietary fat intake -     Lipid panel  Aortic atherosclerosis (HCC)  Benign prostatic hyperplasia without lower urinary tract symptoms    Follow-up pending labs, yearly for physical

## 2023-07-23 NOTE — Progress Notes (Signed)
Sent order to [facility] for CPAP machine and Supplies

## 2023-07-23 NOTE — Addendum Note (Signed)
Addended by: Herminio Commons A on: 07/23/2023 03:08 PM   Modules accepted: Orders

## 2023-07-24 ENCOUNTER — Other Ambulatory Visit: Payer: Self-pay | Admitting: Medical

## 2023-07-24 ENCOUNTER — Other Ambulatory Visit: Payer: Self-pay | Admitting: Internal Medicine

## 2023-07-24 DIAGNOSIS — N182 Chronic kidney disease, stage 2 (mild): Secondary | ICD-10-CM

## 2023-07-24 DIAGNOSIS — R7989 Other specified abnormal findings of blood chemistry: Secondary | ICD-10-CM

## 2023-07-24 LAB — LIPID PANEL
Chol/HDL Ratio: 5.2 ratio — ABNORMAL HIGH (ref 0.0–5.0)
Cholesterol, Total: 187 mg/dL (ref 100–199)
HDL: 36 mg/dL — ABNORMAL LOW (ref >39)
LDL Chol Calc (NIH): 126 mg/dL — ABNORMAL HIGH (ref 0–99)
Triglycerides: 140 mg/dL (ref 0–149)
VLDL Cholesterol Cal: 25 mg/dL (ref 5–40)

## 2023-07-24 LAB — COMPREHENSIVE METABOLIC PANEL
ALT: 19 [IU]/L (ref 0–44)
AST: 18 IU/L (ref 0–40)
Albumin: 4.3 g/dL (ref 3.9–4.9)
Alkaline Phosphatase: 91 [IU]/L (ref 44–121)
BUN/Creatinine Ratio: 13 (ref 10–24)
BUN: 24 mg/dL (ref 8–27)
Bilirubin Total: 0.5 mg/dL (ref 0.0–1.2)
CO2: 21 mmol/L (ref 20–29)
Calcium: 9.5 mg/dL (ref 8.6–10.2)
Chloride: 106 mmol/L (ref 96–106)
Creatinine, Ser: 1.86 mg/dL — ABNORMAL HIGH (ref 0.76–1.27)
Globulin, Total: 2.9 g/dL (ref 1.5–4.5)
Glucose: 102 mg/dL — ABNORMAL HIGH (ref 70–99)
Potassium: 3.9 mmol/L (ref 3.5–5.2)
Sodium: 143 mmol/L (ref 134–144)
Total Protein: 7.2 g/dL (ref 6.0–8.5)
eGFR: 39 mL/min/{1.73_m2} — ABNORMAL LOW (ref >59)

## 2023-07-24 LAB — CBC
Hematocrit: 39.3 % (ref 37.5–51.0)
Hemoglobin: 13.3 g/dL (ref 13.0–17.7)
MCH: 31.5 pg (ref 26.6–33.0)
MCHC: 33.8 g/dL (ref 31.5–35.7)
MCV: 93 fL (ref 79–97)
Platelets: 251 10*3/uL (ref 150–450)
RBC: 4.22 x10E6/uL (ref 4.14–5.80)
RDW: 13.1 % (ref 11.6–15.4)
WBC: 6.2 10*3/uL (ref 3.4–10.8)

## 2023-07-24 LAB — PSA: Prostate Specific Ag, Serum: 1 ng/mL (ref 0.0–4.0)

## 2023-07-24 LAB — HEMOGLOBIN A1C
Est. average glucose Bld gHb Est-mCnc: 137 mg/dL
Hgb A1c MFr Bld: 6.4 % — ABNORMAL HIGH (ref 4.8–5.6)

## 2023-07-24 MED ORDER — LOSARTAN POTASSIUM 100 MG PO TABS: 100.0000 mg | ORAL_TABLET | Freq: Every day | ORAL | 2 refills | Status: AC

## 2023-07-24 MED ORDER — CARVEDILOL 6.25 MG PO TABS: ORAL_TABLET | ORAL | 2 refills | Status: DC

## 2023-07-24 MED ORDER — ATORVASTATIN CALCIUM 80 MG PO TABS: 80.0000 mg | ORAL_TABLET | Freq: Every day | ORAL | 2 refills | Status: AC

## 2023-07-24 MED ORDER — OMEPRAZOLE 40 MG PO CPDR: 40.0000 mg | DELAYED_RELEASE_CAPSULE | Freq: Every day | ORAL | 2 refills | Status: AC

## 2023-07-24 MED ORDER — AMLODIPINE BESYLATE 5 MG PO TABS: 5.0000 mg | ORAL_TABLET | Freq: Every day | ORAL | 2 refills | Status: DC

## 2023-07-24 MED ORDER — GABAPENTIN 300 MG PO CAPS: 300.0000 mg | ORAL_CAPSULE | Freq: Two times a day (BID) | ORAL | 2 refills | Status: AC

## 2023-07-24 NOTE — Progress Notes (Signed)
Liver and electrolytes okay.  Cholesterol could be better, diabetes marker stable at 6.4%.  Blood counts normal.  PSA prostate marker normal.  Kidney marker abnormal and a little worse than prior.  There was a prior referral to nephrology kidney doctor last year.  Are you seeing [facility] or other kidney doctor in regards to chronic kidney disease?  If not, let me know so I can refer.  Check your insurance to see if they cover Wegovy or Zepbound.  These are medicines that can help with blood sugar control and help prevention of progression to diabetes but also help with weight loss.  I would like to use one of these medicines if insurance will cover it  Expect a call about CPAP.  Lets see if more affordable now.

## 2024-01-17 ENCOUNTER — Other Ambulatory Visit: Payer: Self-pay | Admitting: Student

## 2024-01-17 DIAGNOSIS — J449 Chronic obstructive pulmonary disease, unspecified: Secondary | ICD-10-CM

## 2024-01-24 ENCOUNTER — Other Ambulatory Visit: Payer: Self-pay | Admitting: Student

## 2024-01-24 DIAGNOSIS — R0602 Shortness of breath: Secondary | ICD-10-CM

## 2024-02-01 ENCOUNTER — Ambulatory Visit
Admission: RE | Admit: 2024-02-01 | Discharge: 2024-02-01 | Disposition: A | Discharge status: Home / Self Care | Source: Ambulatory Visit | Attending: Student | Admitting: Student

## 2024-02-01 DIAGNOSIS — R0602 Shortness of breath: Secondary | ICD-10-CM

## 2024-02-28 ENCOUNTER — Other Ambulatory Visit: Payer: Self-pay | Admitting: Medical

## 2024-05-28 ENCOUNTER — Other Ambulatory Visit: Payer: Self-pay | Admitting: Medical

## 2024-07-28 ENCOUNTER — Encounter: Payer: Managed Care, Other (non HMO) | Admitting: Medical

## 2024-07-29 ENCOUNTER — Other Ambulatory Visit: Payer: Self-pay | Admitting: Medical

## 2024-08-27 ENCOUNTER — Other Ambulatory Visit: Payer: Self-pay | Admitting: Medical

## 2024-09-21 ENCOUNTER — Telehealth: Payer: Self-pay | Admitting: Medical

## 2024-09-22 NOTE — Telephone Encounter (Signed)
 Patient last visit was 07/2023. Unable to send in medication until he is seen since its been over a year since seen. Please schedule

## 2024-09-22 NOTE — Telephone Encounter (Signed)
 Left message needs to schedule an appt -lm

## 2024-09-24 ENCOUNTER — Other Ambulatory Visit: Payer: Self-pay | Admitting: Medical

## 2024-09-24 NOTE — Telephone Encounter (Signed)
 Pt not seen since 07/2023. Leita left message yesterday for pt that he needs an appt
